# Patient Record
Sex: Female | Born: 1955
Health system: Southern US, Community
[De-identification: ages and names within clinical notes are randomized; demographics above are authoritative.]

## PROBLEM LIST (undated history)

## (undated) DIAGNOSIS — J45909 Unspecified asthma, uncomplicated: Secondary | ICD-10-CM

## (undated) DIAGNOSIS — I10 Essential (primary) hypertension: Secondary | ICD-10-CM

---

## 2015-02-04 ENCOUNTER — Institutional Professional Consult (permissible substitution) (HOSPITAL_COMMUNITY): Payer: Self-pay

## 2015-02-04 ENCOUNTER — Inpatient Hospital Stay
Admission: AD | Admit: 2015-02-04 | Discharge: 2015-03-19 | Disposition: A | Discharge status: Skilled Nursing Facility | Payer: Self-pay | Source: Ambulatory Visit | Attending: Internal Medicine | Admitting: Internal Medicine

## 2015-02-04 DIAGNOSIS — R509 Fever, unspecified: Secondary | ICD-10-CM

## 2015-02-04 DIAGNOSIS — J449 Chronic obstructive pulmonary disease, unspecified: Secondary | ICD-10-CM

## 2015-02-04 DIAGNOSIS — Z4659 Encounter for fitting and adjustment of other gastrointestinal appliance and device: Secondary | ICD-10-CM

## 2015-02-04 DIAGNOSIS — R1314 Dysphagia, pharyngoesophageal phase: Secondary | ICD-10-CM | POA: Insufficient documentation

## 2015-02-04 DIAGNOSIS — K7460 Unspecified cirrhosis of liver: Secondary | ICD-10-CM

## 2015-02-04 DIAGNOSIS — Z95828 Presence of other vascular implants and grafts: Secondary | ICD-10-CM

## 2015-02-04 DIAGNOSIS — J9690 Respiratory failure, unspecified, unspecified whether with hypoxia or hypercapnia: Secondary | ICD-10-CM

## 2015-02-04 DIAGNOSIS — J189 Pneumonia, unspecified organism: Secondary | ICD-10-CM

## 2015-02-04 HISTORY — DX: Essential (primary) hypertension: I10

## 2015-02-04 HISTORY — DX: Unspecified asthma, uncomplicated: J45.909

## 2015-02-05 ENCOUNTER — Other Ambulatory Visit (HOSPITAL_COMMUNITY): Payer: Self-pay

## 2015-02-05 LAB — URINALYSIS, ROUTINE W REFLEX MICROSCOPIC
Bilirubin Urine: NEGATIVE
Glucose, UA: NEGATIVE mg/dL
Ketones, ur: NEGATIVE mg/dL
Nitrite: POSITIVE — AB
Protein, ur: NEGATIVE mg/dL
Specific Gravity, Urine: 1.016 (ref 1.005–1.030)
Urobilinogen, UA: 0.2 mg/dL (ref 0.0–1.0)
pH: 8 (ref 5.0–8.0)

## 2015-02-05 LAB — CBC WITH DIFFERENTIAL/PLATELET
Basophils Absolute: 0 10*3/uL (ref 0.0–0.1)
Basophils Relative: 0 % (ref 0–1)
Eosinophils Absolute: 0.2 10*3/uL (ref 0.0–0.7)
Eosinophils Relative: 1 % (ref 0–5)
HCT: 30.5 % — ABNORMAL LOW (ref 36.0–46.0)
Hemoglobin: 9.9 g/dL — ABNORMAL LOW (ref 12.0–15.0)
Lymphocytes Relative: 8 % — ABNORMAL LOW (ref 12–46)
Lymphs Abs: 1.2 10*3/uL (ref 0.7–4.0)
MCH: 27.6 pg (ref 26.0–34.0)
MCHC: 32.5 g/dL (ref 30.0–36.0)
MCV: 85 fL (ref 78.0–100.0)
Monocytes Absolute: 1 10*3/uL (ref 0.1–1.0)
Monocytes Relative: 6 % (ref 3–12)
Neutro Abs: 13.4 10*3/uL — ABNORMAL HIGH (ref 1.7–7.7)
Neutrophils Relative %: 85 % — ABNORMAL HIGH (ref 43–77)
Platelets: 258 10*3/uL (ref 150–400)
RBC: 3.59 MIL/uL — ABNORMAL LOW (ref 3.87–5.11)
RDW: 17.9 % — ABNORMAL HIGH (ref 11.5–15.5)
WBC: 15.8 10*3/uL — ABNORMAL HIGH (ref 4.0–10.5)

## 2015-02-05 LAB — COMPREHENSIVE METABOLIC PANEL
ALBUMIN: 1.8 g/dL — AB (ref 3.5–5.0)
ALT: 25 U/L (ref 14–54)
AST: 35 U/L (ref 15–41)
Alkaline Phosphatase: 248 U/L — ABNORMAL HIGH (ref 38–126)
Anion gap: 11 (ref 5–15)
BUN: 14 mg/dL (ref 6–20)
CO2: 22 mmol/L (ref 22–32)
CREATININE: 0.51 mg/dL (ref 0.44–1.00)
Calcium: 8.5 mg/dL — ABNORMAL LOW (ref 8.9–10.3)
Chloride: 96 mmol/L — ABNORMAL LOW (ref 101–111)
Glucose, Bld: 162 mg/dL — ABNORMAL HIGH (ref 65–99)
Potassium: 4.6 mmol/L (ref 3.5–5.1)
Sodium: 129 mmol/L — ABNORMAL LOW (ref 135–145)
Total Bilirubin: 0.5 mg/dL (ref 0.3–1.2)
Total Protein: 5.9 g/dL — ABNORMAL LOW (ref 6.5–8.1)

## 2015-02-05 LAB — PHOSPHORUS: Phosphorus: 5.1 mg/dL — ABNORMAL HIGH (ref 2.5–4.6)

## 2015-02-05 LAB — URINE MICROSCOPIC-ADD ON

## 2015-02-05 LAB — TSH: TSH: 3.931 u[IU]/mL (ref 0.350–4.500)

## 2015-02-05 LAB — MAGNESIUM: Magnesium: 1.8 mg/dL (ref 1.7–2.4)

## 2015-02-06 LAB — BASIC METABOLIC PANEL
Anion gap: 8 (ref 5–15)
BUN: 16 mg/dL (ref 6–20)
CHLORIDE: 101 mmol/L (ref 101–111)
CO2: 22 mmol/L (ref 22–32)
CREATININE: 0.52 mg/dL (ref 0.44–1.00)
Calcium: 8.7 mg/dL — ABNORMAL LOW (ref 8.9–10.3)
GFR calc Af Amer: >60 mL/min (ref >60)
Glucose, Bld: 167 mg/dL — ABNORMAL HIGH (ref 65–99)
POTASSIUM: 3.8 mmol/L (ref 3.5–5.1)
Sodium: 131 mmol/L — ABNORMAL LOW (ref 135–145)

## 2015-02-06 LAB — CBC
HEMATOCRIT: 29.8 % — AB (ref 36.0–46.0)
Hemoglobin: 9.6 g/dL — ABNORMAL LOW (ref 12.0–15.0)
MCH: 27.7 pg (ref 26.0–34.0)
MCHC: 32.2 g/dL (ref 30.0–36.0)
MCV: 86.1 fL (ref 78.0–100.0)
Platelets: 266 10*3/uL (ref 150–400)
RBC: 3.46 MIL/uL — ABNORMAL LOW (ref 3.87–5.11)
RDW: 18.3 % — ABNORMAL HIGH (ref 11.5–15.5)
WBC: 19.5 10*3/uL — ABNORMAL HIGH (ref 4.0–10.5)

## 2015-02-07 LAB — CLOSTRIDIUM DIFFICILE BY PCR: CDIFFPCR: NEGATIVE

## 2015-02-08 ENCOUNTER — Other Ambulatory Visit (HOSPITAL_COMMUNITY): Payer: Self-pay

## 2015-02-08 ENCOUNTER — Institutional Professional Consult (permissible substitution) (HOSPITAL_COMMUNITY): Payer: Self-pay

## 2015-02-08 LAB — CBC WITH DIFFERENTIAL/PLATELET
BASOS PCT: 0 % (ref 0–1)
Basophils Absolute: 0 10*3/uL (ref 0.0–0.1)
EOS ABS: 0.4 10*3/uL (ref 0.0–0.7)
EOS PCT: 2 % (ref 0–5)
HEMATOCRIT: 28.1 % — AB (ref 36.0–46.0)
Hemoglobin: 9.1 g/dL — ABNORMAL LOW (ref 12.0–15.0)
LYMPHS ABS: 2.5 10*3/uL (ref 0.7–4.0)
Lymphocytes Relative: 12 % (ref 12–46)
MCH: 28.7 pg (ref 26.0–34.0)
MCHC: 32.4 g/dL (ref 30.0–36.0)
MCV: 88.6 fL (ref 78.0–100.0)
MONO ABS: 1 10*3/uL (ref 0.1–1.0)
Monocytes Relative: 5 % (ref 3–12)
Neutro Abs: 16.9 10*3/uL — ABNORMAL HIGH (ref 1.7–7.7)
Neutrophils Relative %: 81 % — ABNORMAL HIGH (ref 43–77)
Platelets: 266 10*3/uL (ref 150–400)
RBC: 3.17 MIL/uL — ABNORMAL LOW (ref 3.87–5.11)
RDW: 17.7 % — ABNORMAL HIGH (ref 11.5–15.5)
WBC: 20.8 10*3/uL — AB (ref 4.0–10.5)

## 2015-02-08 LAB — COMPREHENSIVE METABOLIC PANEL
ALK PHOS: 248 U/L — AB (ref 38–126)
ALT: 24 U/L (ref 14–54)
AST: 34 U/L (ref 15–41)
Albumin: 1.8 g/dL — ABNORMAL LOW (ref 3.5–5.0)
Anion gap: 8 (ref 5–15)
BUN: 16 mg/dL (ref 6–20)
CO2: 24 mmol/L (ref 22–32)
Calcium: 8.7 mg/dL — ABNORMAL LOW (ref 8.9–10.3)
Chloride: 101 mmol/L (ref 101–111)
Creatinine, Ser: 0.42 mg/dL — ABNORMAL LOW (ref 0.44–1.00)
GFR calc Af Amer: >60 mL/min (ref >60)
GLUCOSE: 135 mg/dL — AB (ref 65–99)
Potassium: 4.3 mmol/L (ref 3.5–5.1)
Sodium: 133 mmol/L — ABNORMAL LOW (ref 135–145)
Total Bilirubin: 0.6 mg/dL (ref 0.3–1.2)
Total Protein: 6 g/dL — ABNORMAL LOW (ref 6.5–8.1)

## 2015-02-08 LAB — LACTIC ACID, PLASMA
LACTIC ACID, VENOUS: 1.1 mmol/L (ref 0.5–2.0)
Lactic Acid, Venous: 1.1 mmol/L (ref 0.5–2.0)

## 2015-02-08 LAB — URINE CULTURE: Culture: >=100000

## 2015-02-09 LAB — URINALYSIS, ROUTINE W REFLEX MICROSCOPIC
BILIRUBIN URINE: NEGATIVE
Glucose, UA: NEGATIVE mg/dL
KETONES UR: NEGATIVE mg/dL
NITRITE: NEGATIVE
PH: 7.5 (ref 5.0–8.0)
PROTEIN: 30 mg/dL — AB
SPECIFIC GRAVITY, URINE: 1.016 (ref 1.005–1.030)
Urobilinogen, UA: 0.2 mg/dL (ref 0.0–1.0)

## 2015-02-09 LAB — CBC WITH DIFFERENTIAL/PLATELET
BASOS ABS: 0 10*3/uL (ref 0.0–0.1)
Basophils Relative: 0 % (ref 0–1)
EOS PCT: 2 % (ref 0–5)
Eosinophils Absolute: 0.4 10*3/uL (ref 0.0–0.7)
HCT: 30.3 % — ABNORMAL LOW (ref 36.0–46.0)
Hemoglobin: 9.7 g/dL — ABNORMAL LOW (ref 12.0–15.0)
Lymphocytes Relative: 10 % — ABNORMAL LOW (ref 12–46)
Lymphs Abs: 1.9 10*3/uL (ref 0.7–4.0)
MCH: 28.3 pg (ref 26.0–34.0)
MCHC: 32 g/dL (ref 30.0–36.0)
MCV: 88.3 fL (ref 78.0–100.0)
MONOS PCT: 5 % (ref 3–12)
Monocytes Absolute: 1 10*3/uL (ref 0.1–1.0)
NEUTROS ABS: 15.8 10*3/uL — AB (ref 1.7–7.7)
NEUTROS PCT: 83 % — AB (ref 43–77)
PLATELETS: 271 10*3/uL (ref 150–400)
RBC: 3.43 MIL/uL — AB (ref 3.87–5.11)
RDW: 17.7 % — AB (ref 11.5–15.5)
WBC: 19.1 10*3/uL — ABNORMAL HIGH (ref 4.0–10.5)

## 2015-02-09 LAB — BASIC METABOLIC PANEL
Anion gap: 6 (ref 5–15)
BUN: 14 mg/dL (ref 6–20)
CO2: 23 mmol/L (ref 22–32)
Calcium: 8.4 mg/dL — ABNORMAL LOW (ref 8.9–10.3)
Chloride: 102 mmol/L (ref 101–111)
Creatinine, Ser: 0.38 mg/dL — ABNORMAL LOW (ref 0.44–1.00)
GFR calc Af Amer: >60 mL/min (ref >60)
GFR calc non Af Amer: >60 mL/min (ref >60)
GLUCOSE: 159 mg/dL — AB (ref 65–99)
Potassium: 4.3 mmol/L (ref 3.5–5.1)
SODIUM: 131 mmol/L — AB (ref 135–145)

## 2015-02-09 LAB — URINE MICROSCOPIC-ADD ON

## 2015-02-11 LAB — CBC WITH DIFFERENTIAL/PLATELET
Basophils Absolute: 0 10*3/uL (ref 0.0–0.1)
Basophils Relative: 0 % (ref 0–1)
Eosinophils Absolute: 0.3 10*3/uL (ref 0.0–0.7)
Eosinophils Relative: 2 % (ref 0–5)
HCT: 27.7 % — ABNORMAL LOW (ref 36.0–46.0)
HEMOGLOBIN: 9 g/dL — AB (ref 12.0–15.0)
Lymphocytes Relative: 12 % (ref 12–46)
Lymphs Abs: 2.1 10*3/uL (ref 0.7–4.0)
MCH: 28.7 pg (ref 26.0–34.0)
MCHC: 32.5 g/dL (ref 30.0–36.0)
MCV: 88.2 fL (ref 78.0–100.0)
MONO ABS: 1 10*3/uL (ref 0.1–1.0)
MONOS PCT: 6 % (ref 3–12)
Neutro Abs: 13.9 10*3/uL — ABNORMAL HIGH (ref 1.7–7.7)
Neutrophils Relative %: 80 % — ABNORMAL HIGH (ref 43–77)
PLATELETS: 282 10*3/uL (ref 150–400)
RBC: 3.14 MIL/uL — ABNORMAL LOW (ref 3.87–5.11)
RDW: 17.5 % — AB (ref 11.5–15.5)
WBC: 17.3 10*3/uL — ABNORMAL HIGH (ref 4.0–10.5)

## 2015-02-11 LAB — BASIC METABOLIC PANEL
Anion gap: 7 (ref 5–15)
BUN: 17 mg/dL (ref 6–20)
CHLORIDE: 103 mmol/L (ref 101–111)
CO2: 23 mmol/L (ref 22–32)
Calcium: 8.7 mg/dL — ABNORMAL LOW (ref 8.9–10.3)
Creatinine, Ser: 0.36 mg/dL — ABNORMAL LOW (ref 0.44–1.00)
GFR calc non Af Amer: >60 mL/min (ref >60)
Glucose, Bld: 123 mg/dL — ABNORMAL HIGH (ref 65–99)
Potassium: 4.9 mmol/L (ref 3.5–5.1)
SODIUM: 133 mmol/L — AB (ref 135–145)

## 2015-02-11 LAB — URINE CULTURE: CULTURE: NO GROWTH

## 2015-02-11 LAB — CLOSTRIDIUM DIFFICILE BY PCR: Toxigenic C. Difficile by PCR: NEGATIVE

## 2015-02-11 LAB — PROCALCITONIN: Procalcitonin: 0.4 ng/mL

## 2015-02-13 LAB — CULTURE, BLOOD (ROUTINE X 2)
CULTURE: NO GROWTH
Culture: NO GROWTH

## 2015-02-15 ENCOUNTER — Other Ambulatory Visit (HOSPITAL_COMMUNITY): Payer: Self-pay

## 2015-02-15 LAB — COMPREHENSIVE METABOLIC PANEL
ALBUMIN: 1.8 g/dL — AB (ref 3.5–5.0)
ALK PHOS: 205 U/L — AB (ref 38–126)
ALT: 17 U/L (ref 14–54)
ANION GAP: 7 (ref 5–15)
AST: 31 U/L (ref 15–41)
BILIRUBIN TOTAL: 0.4 mg/dL (ref 0.3–1.2)
BUN: 16 mg/dL (ref 6–20)
CHLORIDE: 102 mmol/L (ref 101–111)
CO2: 27 mmol/L (ref 22–32)
Calcium: 8.9 mg/dL (ref 8.9–10.3)
Creatinine, Ser: 0.51 mg/dL (ref 0.44–1.00)
GFR calc non Af Amer: >60 mL/min (ref >60)
GLUCOSE: 120 mg/dL — AB (ref 65–99)
Potassium: 4.7 mmol/L (ref 3.5–5.1)
SODIUM: 136 mmol/L (ref 135–145)
Total Protein: 6.6 g/dL (ref 6.5–8.1)

## 2015-02-15 LAB — CBC WITH DIFFERENTIAL/PLATELET
Basophils Absolute: 0.1 10*3/uL (ref 0.0–0.1)
Basophils Relative: 1 % (ref 0–1)
Eosinophils Absolute: 0.6 10*3/uL (ref 0.0–0.7)
Eosinophils Relative: 4 % (ref 0–5)
HCT: 28.1 % — ABNORMAL LOW (ref 36.0–46.0)
Hemoglobin: 8.8 g/dL — ABNORMAL LOW (ref 12.0–15.0)
LYMPHS PCT: 22 % (ref 12–46)
Lymphs Abs: 3.1 10*3/uL (ref 0.7–4.0)
MCH: 27.8 pg (ref 26.0–34.0)
MCHC: 31.3 g/dL (ref 30.0–36.0)
MCV: 88.9 fL (ref 78.0–100.0)
MONO ABS: 0.8 10*3/uL (ref 0.1–1.0)
Monocytes Relative: 6 % (ref 3–12)
Neutro Abs: 9.4 10*3/uL — ABNORMAL HIGH (ref 1.7–7.7)
Neutrophils Relative %: 67 % (ref 43–77)
PLATELETS: 351 10*3/uL (ref 150–400)
RBC: 3.16 MIL/uL — ABNORMAL LOW (ref 3.87–5.11)
RDW: 16.7 % — ABNORMAL HIGH (ref 11.5–15.5)
WBC: 14 10*3/uL — ABNORMAL HIGH (ref 4.0–10.5)

## 2015-02-15 LAB — URINALYSIS, ROUTINE W REFLEX MICROSCOPIC
Bilirubin Urine: NEGATIVE
Glucose, UA: NEGATIVE mg/dL
HGB URINE DIPSTICK: NEGATIVE
Ketones, ur: NEGATIVE mg/dL
NITRITE: NEGATIVE
PROTEIN: NEGATIVE mg/dL
Specific Gravity, Urine: 1.021 (ref 1.005–1.030)
UROBILINOGEN UA: 1 mg/dL (ref 0.0–1.0)
pH: 7.5 (ref 5.0–8.0)

## 2015-02-15 LAB — URINE MICROSCOPIC-ADD ON

## 2015-02-15 LAB — MAGNESIUM: Magnesium: 1.8 mg/dL (ref 1.7–2.4)

## 2015-02-15 LAB — PHOSPHORUS: Phosphorus: 4.2 mg/dL (ref 2.5–4.6)

## 2015-02-16 LAB — BASIC METABOLIC PANEL
Anion gap: 9 (ref 5–15)
BUN: 12 mg/dL (ref 6–20)
CALCIUM: 9.2 mg/dL (ref 8.9–10.3)
CHLORIDE: 98 mmol/L — AB (ref 101–111)
CO2: 26 mmol/L (ref 22–32)
CREATININE: 0.31 mg/dL — AB (ref 0.44–1.00)
GFR calc non Af Amer: >60 mL/min (ref >60)
Glucose, Bld: 126 mg/dL — ABNORMAL HIGH (ref 65–99)
Potassium: 4.4 mmol/L (ref 3.5–5.1)
Sodium: 133 mmol/L — ABNORMAL LOW (ref 135–145)

## 2015-02-16 LAB — CBC WITH DIFFERENTIAL/PLATELET
Basophils Absolute: 0.1 10*3/uL (ref 0.0–0.1)
Basophils Relative: 1 % (ref 0–1)
EOS PCT: 4 % (ref 0–5)
Eosinophils Absolute: 0.6 10*3/uL (ref 0.0–0.7)
HCT: 29 % — ABNORMAL LOW (ref 36.0–46.0)
HEMOGLOBIN: 9.1 g/dL — AB (ref 12.0–15.0)
LYMPHS PCT: 22 % (ref 12–46)
Lymphs Abs: 2.7 10*3/uL (ref 0.7–4.0)
MCH: 28 pg (ref 26.0–34.0)
MCHC: 31.4 g/dL (ref 30.0–36.0)
MCV: 89.2 fL (ref 78.0–100.0)
Monocytes Absolute: 0.7 10*3/uL (ref 0.1–1.0)
Monocytes Relative: 5 % (ref 3–12)
NEUTROS ABS: 8.5 10*3/uL — AB (ref 1.7–7.7)
NEUTROS PCT: 68 % (ref 43–77)
Platelets: 356 10*3/uL (ref 150–400)
RBC: 3.25 MIL/uL — AB (ref 3.87–5.11)
RDW: 16.4 % — ABNORMAL HIGH (ref 11.5–15.5)
WBC: 12.4 10*3/uL — ABNORMAL HIGH (ref 4.0–10.5)

## 2015-02-16 LAB — C DIFFICILE QUICK SCREEN W PCR REFLEX
C DIFFICLE (CDIFF) ANTIGEN: POSITIVE — AB
C Diff toxin: NEGATIVE

## 2015-02-16 LAB — CLOSTRIDIUM DIFFICILE BY PCR: Toxigenic C. Difficile by PCR: POSITIVE — AB

## 2015-02-16 LAB — PHOSPHORUS: Phosphorus: 4.6 mg/dL (ref 2.5–4.6)

## 2015-02-16 LAB — MAGNESIUM: Magnesium: 1.8 mg/dL (ref 1.7–2.4)

## 2015-02-17 LAB — URINE CULTURE: CULTURE: NO GROWTH

## 2015-02-19 ENCOUNTER — Other Ambulatory Visit (HOSPITAL_COMMUNITY): Payer: Self-pay

## 2015-02-20 LAB — CBC WITH DIFFERENTIAL/PLATELET
BASOS PCT: 1 % (ref 0–1)
Basophils Absolute: 0.1 10*3/uL (ref 0.0–0.1)
EOS ABS: 0.4 10*3/uL (ref 0.0–0.7)
Eosinophils Relative: 3 % (ref 0–5)
HCT: 29.7 % — ABNORMAL LOW (ref 36.0–46.0)
Hemoglobin: 9.5 g/dL — ABNORMAL LOW (ref 12.0–15.0)
LYMPHS ABS: 2.9 10*3/uL (ref 0.7–4.0)
Lymphocytes Relative: 26 % (ref 12–46)
MCH: 28.2 pg (ref 26.0–34.0)
MCHC: 32 g/dL (ref 30.0–36.0)
MCV: 88.1 fL (ref 78.0–100.0)
Monocytes Absolute: 0.8 10*3/uL (ref 0.1–1.0)
Monocytes Relative: 7 % (ref 3–12)
Neutro Abs: 6.9 10*3/uL (ref 1.7–7.7)
Neutrophils Relative %: 63 % (ref 43–77)
Platelets: 395 10*3/uL (ref 150–400)
RBC: 3.37 MIL/uL — ABNORMAL LOW (ref 3.87–5.11)
RDW: 15.5 % (ref 11.5–15.5)
WBC: 10.9 10*3/uL — ABNORMAL HIGH (ref 4.0–10.5)

## 2015-02-20 LAB — BASIC METABOLIC PANEL
Anion gap: 10 (ref 5–15)
BUN: 9 mg/dL (ref 6–20)
CALCIUM: 8.9 mg/dL (ref 8.9–10.3)
CO2: 25 mmol/L (ref 22–32)
Chloride: 98 mmol/L — ABNORMAL LOW (ref 101–111)
Creatinine, Ser: 0.37 mg/dL — ABNORMAL LOW (ref 0.44–1.00)
Glucose, Bld: 164 mg/dL — ABNORMAL HIGH (ref 65–99)
Potassium: 4.8 mmol/L (ref 3.5–5.1)
SODIUM: 133 mmol/L — AB (ref 135–145)

## 2015-02-20 LAB — PROCALCITONIN: Procalcitonin: 0.16 ng/mL

## 2015-02-22 ENCOUNTER — Other Ambulatory Visit (HOSPITAL_COMMUNITY): Payer: Self-pay

## 2015-02-22 LAB — URINALYSIS, ROUTINE W REFLEX MICROSCOPIC
Bilirubin Urine: NEGATIVE
GLUCOSE, UA: NEGATIVE mg/dL
KETONES UR: NEGATIVE mg/dL
Nitrite: NEGATIVE
PROTEIN: NEGATIVE mg/dL
Specific Gravity, Urine: 1.024 (ref 1.005–1.030)
Urobilinogen, UA: 0.2 mg/dL (ref 0.0–1.0)
pH: 7 (ref 5.0–8.0)

## 2015-02-22 LAB — URINE MICROSCOPIC-ADD ON

## 2015-02-23 ENCOUNTER — Other Ambulatory Visit (HOSPITAL_COMMUNITY): Payer: Self-pay

## 2015-02-23 LAB — COMPREHENSIVE METABOLIC PANEL
ALK PHOS: 153 U/L — AB (ref 38–126)
ALT: 15 U/L (ref 14–54)
ANION GAP: 10 (ref 5–15)
AST: 26 U/L (ref 15–41)
Albumin: 2.3 g/dL — ABNORMAL LOW (ref 3.5–5.0)
BILIRUBIN TOTAL: 0.3 mg/dL (ref 0.3–1.2)
BUN: 11 mg/dL (ref 6–20)
CHLORIDE: 96 mmol/L — AB (ref 101–111)
CO2: 27 mmol/L (ref 22–32)
CREATININE: 0.34 mg/dL — AB (ref 0.44–1.00)
Calcium: 9.3 mg/dL (ref 8.9–10.3)
GFR calc non Af Amer: >60 mL/min (ref >60)
Glucose, Bld: 120 mg/dL — ABNORMAL HIGH (ref 65–99)
Potassium: 4.2 mmol/L (ref 3.5–5.1)
Sodium: 133 mmol/L — ABNORMAL LOW (ref 135–145)
TOTAL PROTEIN: 7.4 g/dL (ref 6.5–8.1)

## 2015-02-23 LAB — CBC WITH DIFFERENTIAL/PLATELET
Basophils Absolute: 0 10*3/uL (ref 0.0–0.1)
Basophils Relative: 0 % (ref 0–1)
EOS PCT: 3 % (ref 0–5)
Eosinophils Absolute: 0.4 10*3/uL (ref 0.0–0.7)
HCT: 28.1 % — ABNORMAL LOW (ref 36.0–46.0)
HEMOGLOBIN: 9 g/dL — AB (ref 12.0–15.0)
Lymphocytes Relative: 25 % (ref 12–46)
Lymphs Abs: 3.1 10*3/uL (ref 0.7–4.0)
MCH: 28 pg (ref 26.0–34.0)
MCHC: 32 g/dL (ref 30.0–36.0)
MCV: 87.5 fL (ref 78.0–100.0)
MONOS PCT: 6 % (ref 3–12)
Monocytes Absolute: 0.8 10*3/uL (ref 0.1–1.0)
Neutro Abs: 8 10*3/uL — ABNORMAL HIGH (ref 1.7–7.7)
Neutrophils Relative %: 66 % (ref 43–77)
PLATELETS: 432 10*3/uL — AB (ref 150–400)
RBC: 3.21 MIL/uL — AB (ref 3.87–5.11)
RDW: 14.9 % (ref 11.5–15.5)
WBC: 12.2 10*3/uL — ABNORMAL HIGH (ref 4.0–10.5)

## 2015-02-23 LAB — PROCALCITONIN: PROCALCITONIN: 0.22 ng/mL

## 2015-02-24 ENCOUNTER — Other Ambulatory Visit (HOSPITAL_COMMUNITY): Payer: Self-pay

## 2015-02-24 LAB — URINE CULTURE: Culture: 50000

## 2015-02-25 LAB — BASIC METABOLIC PANEL
Anion gap: 11 (ref 5–15)
BUN: 26 mg/dL — AB (ref 6–20)
CHLORIDE: 99 mmol/L — AB (ref 101–111)
CO2: 23 mmol/L (ref 22–32)
Calcium: 9.1 mg/dL (ref 8.9–10.3)
Creatinine, Ser: 0.46 mg/dL (ref 0.44–1.00)
GFR calc non Af Amer: >60 mL/min (ref >60)
Glucose, Bld: 201 mg/dL — ABNORMAL HIGH (ref 65–99)
POTASSIUM: 4.4 mmol/L (ref 3.5–5.1)
Sodium: 133 mmol/L — ABNORMAL LOW (ref 135–145)

## 2015-02-25 LAB — CBC WITH DIFFERENTIAL/PLATELET
Basophils Absolute: 0 10*3/uL (ref 0.0–0.1)
Basophils Relative: 0 % (ref 0–1)
EOS ABS: 0.5 10*3/uL (ref 0.0–0.7)
EOS PCT: 2 % (ref 0–5)
HEMATOCRIT: 27 % — AB (ref 36.0–46.0)
HEMOGLOBIN: 8.8 g/dL — AB (ref 12.0–15.0)
LYMPHS PCT: 14 % (ref 12–46)
Lymphs Abs: 3.8 10*3/uL (ref 0.7–4.0)
MCH: 28.6 pg (ref 26.0–34.0)
MCHC: 32.6 g/dL (ref 30.0–36.0)
MCV: 87.7 fL (ref 78.0–100.0)
Monocytes Absolute: 1.6 10*3/uL — ABNORMAL HIGH (ref 0.1–1.0)
Monocytes Relative: 6 % (ref 3–12)
NEUTROS PCT: 78 % — AB (ref 43–77)
Neutro Abs: 21.2 10*3/uL — ABNORMAL HIGH (ref 1.7–7.7)
PLATELETS: 480 10*3/uL — AB (ref 150–400)
RBC: 3.08 MIL/uL — AB (ref 3.87–5.11)
RDW: 14.9 % (ref 11.5–15.5)
WBC MORPHOLOGY: INCREASED
WBC: 27.1 10*3/uL — AB (ref 4.0–10.5)

## 2015-02-25 LAB — CULTURE, BLOOD (ROUTINE X 2)

## 2015-02-25 LAB — PROCALCITONIN: Procalcitonin: 2.37 ng/mL

## 2015-02-26 ENCOUNTER — Other Ambulatory Visit (HOSPITAL_COMMUNITY): Payer: Self-pay

## 2015-02-26 LAB — CBC WITH DIFFERENTIAL/PLATELET
BASOS PCT: 0 % (ref 0–1)
Basophils Absolute: 0 10*3/uL (ref 0.0–0.1)
EOS PCT: 2 % (ref 0–5)
Eosinophils Absolute: 0.5 10*3/uL (ref 0.0–0.7)
HEMATOCRIT: 24.1 % — AB (ref 36.0–46.0)
HEMOGLOBIN: 7.7 g/dL — AB (ref 12.0–15.0)
LYMPHS PCT: 13 % (ref 12–46)
Lymphs Abs: 2.9 10*3/uL (ref 0.7–4.0)
MCH: 27.5 pg (ref 26.0–34.0)
MCHC: 32 g/dL (ref 30.0–36.0)
MCV: 86.1 fL (ref 78.0–100.0)
MONOS PCT: 6 % (ref 3–12)
Monocytes Absolute: 1.4 10*3/uL — ABNORMAL HIGH (ref 0.1–1.0)
NEUTROS ABS: 17.8 10*3/uL — AB (ref 1.7–7.7)
NEUTROS PCT: 79 % — AB (ref 43–77)
Platelets: 402 10*3/uL — ABNORMAL HIGH (ref 150–400)
RBC: 2.8 MIL/uL — AB (ref 3.87–5.11)
RDW: 14.7 % (ref 11.5–15.5)
WBC MORPHOLOGY: INCREASED
WBC: 22.6 10*3/uL — ABNORMAL HIGH (ref 4.0–10.5)

## 2015-02-26 LAB — MAGNESIUM: MAGNESIUM: 1.8 mg/dL (ref 1.7–2.4)

## 2015-02-26 LAB — BASIC METABOLIC PANEL
Anion gap: 11 (ref 5–15)
BUN: 28 mg/dL — ABNORMAL HIGH (ref 6–20)
CALCIUM: 8.8 mg/dL — AB (ref 8.9–10.3)
CO2: 22 mmol/L (ref 22–32)
Chloride: 98 mmol/L — ABNORMAL LOW (ref 101–111)
Creatinine, Ser: 0.55 mg/dL (ref 0.44–1.00)
GFR calc Af Amer: >60 mL/min (ref >60)
GFR calc non Af Amer: >60 mL/min (ref >60)
GLUCOSE: 170 mg/dL — AB (ref 65–99)
Potassium: 4.4 mmol/L (ref 3.5–5.1)
Sodium: 131 mmol/L — ABNORMAL LOW (ref 135–145)

## 2015-02-26 LAB — OCCULT BLOOD X 1 CARD TO LAB, STOOL: Fecal Occult Bld: NEGATIVE

## 2015-02-26 LAB — PHOSPHORUS: PHOSPHORUS: 4.5 mg/dL (ref 2.5–4.6)

## 2015-02-27 LAB — COMPREHENSIVE METABOLIC PANEL
ALT: 16 U/L (ref 14–54)
AST: 28 U/L (ref 15–41)
Albumin: 1.9 g/dL — ABNORMAL LOW (ref 3.5–5.0)
Alkaline Phosphatase: 151 U/L — ABNORMAL HIGH (ref 38–126)
Anion gap: 9 (ref 5–15)
BUN: 16 mg/dL (ref 6–20)
CALCIUM: 8.8 mg/dL — AB (ref 8.9–10.3)
CHLORIDE: 99 mmol/L — AB (ref 101–111)
CO2: 23 mmol/L (ref 22–32)
Creatinine, Ser: 0.4 mg/dL — ABNORMAL LOW (ref 0.44–1.00)
GFR calc non Af Amer: >60 mL/min (ref >60)
Glucose, Bld: 151 mg/dL — ABNORMAL HIGH (ref 65–99)
POTASSIUM: 3.8 mmol/L (ref 3.5–5.1)
SODIUM: 131 mmol/L — AB (ref 135–145)
TOTAL PROTEIN: 5.8 g/dL — AB (ref 6.5–8.1)
Total Bilirubin: 0.4 mg/dL (ref 0.3–1.2)

## 2015-02-27 LAB — CBC WITH DIFFERENTIAL/PLATELET
BASOS PCT: 0 % (ref 0–1)
Basophils Absolute: 0 10*3/uL (ref 0.0–0.1)
EOS ABS: 0.4 10*3/uL (ref 0.0–0.7)
Eosinophils Relative: 2 % (ref 0–5)
HCT: 25.6 % — ABNORMAL LOW (ref 36.0–46.0)
HEMOGLOBIN: 8 g/dL — AB (ref 12.0–15.0)
LYMPHS ABS: 2.3 10*3/uL (ref 0.7–4.0)
Lymphocytes Relative: 13 % (ref 12–46)
MCH: 27.2 pg (ref 26.0–34.0)
MCHC: 31.3 g/dL (ref 30.0–36.0)
MCV: 87.1 fL (ref 78.0–100.0)
MONOS PCT: 5 % (ref 3–12)
Monocytes Absolute: 0.9 10*3/uL (ref 0.1–1.0)
Neutro Abs: 14.4 10*3/uL — ABNORMAL HIGH (ref 1.7–7.7)
Neutrophils Relative %: 80 % — ABNORMAL HIGH (ref 43–77)
PLATELETS: 366 10*3/uL (ref 150–400)
RBC: 2.94 MIL/uL — ABNORMAL LOW (ref 3.87–5.11)
RDW: 14.8 % (ref 11.5–15.5)
WBC: 18 10*3/uL — ABNORMAL HIGH (ref 4.0–10.5)

## 2015-02-27 LAB — CULTURE, BLOOD (ROUTINE X 2): Culture: NO GROWTH

## 2015-03-01 ENCOUNTER — Other Ambulatory Visit (HOSPITAL_COMMUNITY): Payer: Self-pay

## 2015-03-01 LAB — BASIC METABOLIC PANEL
ANION GAP: 11 (ref 5–15)
BUN: 9 mg/dL (ref 6–20)
CALCIUM: 8.1 mg/dL — AB (ref 8.9–10.3)
CO2: 24 mmol/L (ref 22–32)
Chloride: 92 mmol/L — ABNORMAL LOW (ref 101–111)
Creatinine, Ser: 0.41 mg/dL — ABNORMAL LOW (ref 0.44–1.00)
GFR calc non Af Amer: >60 mL/min (ref >60)
Glucose, Bld: 477 mg/dL — ABNORMAL HIGH (ref 65–99)
Potassium: 4.2 mmol/L (ref 3.5–5.1)
Sodium: 127 mmol/L — ABNORMAL LOW (ref 135–145)

## 2015-03-01 LAB — CULTURE, BLOOD (ROUTINE X 2)
Culture: NO GROWTH
Culture: NO GROWTH

## 2015-03-01 LAB — CBC WITH DIFFERENTIAL/PLATELET
BASOS ABS: 0.1 10*3/uL (ref 0.0–0.1)
BASOS PCT: 0 % (ref 0–1)
Eosinophils Absolute: 0.3 10*3/uL (ref 0.0–0.7)
Eosinophils Relative: 2 % (ref 0–5)
HEMATOCRIT: 23.4 % — AB (ref 36.0–46.0)
HEMOGLOBIN: 7.4 g/dL — AB (ref 12.0–15.0)
Lymphocytes Relative: 22 % (ref 12–46)
Lymphs Abs: 3.6 10*3/uL (ref 0.7–4.0)
MCH: 28 pg (ref 26.0–34.0)
MCHC: 31.6 g/dL (ref 30.0–36.0)
MCV: 88.6 fL (ref 78.0–100.0)
Monocytes Absolute: 1 10*3/uL (ref 0.1–1.0)
Monocytes Relative: 6 % (ref 3–12)
NEUTROS ABS: 11.6 10*3/uL — AB (ref 1.7–7.7)
NEUTROS PCT: 70 % (ref 43–77)
Platelets: 350 10*3/uL (ref 150–400)
RBC: 2.64 MIL/uL — AB (ref 3.87–5.11)
RDW: 15.1 % (ref 11.5–15.5)
WBC: 16.5 10*3/uL — ABNORMAL HIGH (ref 4.0–10.5)

## 2015-03-01 LAB — MAGNESIUM: Magnesium: 1.5 mg/dL — ABNORMAL LOW (ref 1.7–2.4)

## 2015-03-01 LAB — PHOSPHORUS: PHOSPHORUS: 4.5 mg/dL (ref 2.5–4.6)

## 2015-03-01 NOTE — Progress Notes (Signed)
IR aware of request for G-tube placement. Review of chart finds no cross-sectional imaging of the abdomen. Pt has NG tube intact by plain X-ray. Will need non-contrasted CT of abdomen to eval for candidacy of percutaneous approach G-tube placement. IR will follow up after CT.  Brayton El PA-C Interventional Radiology 03/01/2015 3:04 PM

## 2015-03-02 DIAGNOSIS — R1314 Dysphagia, pharyngoesophageal phase: Secondary | ICD-10-CM | POA: Insufficient documentation

## 2015-03-02 LAB — URINE MICROSCOPIC-ADD ON

## 2015-03-02 LAB — URINALYSIS, ROUTINE W REFLEX MICROSCOPIC
Bilirubin Urine: NEGATIVE
Glucose, UA: NEGATIVE mg/dL
Ketones, ur: NEGATIVE mg/dL
NITRITE: NEGATIVE
PH: 6 (ref 5.0–8.0)
Protein, ur: NEGATIVE mg/dL
SPECIFIC GRAVITY, URINE: 1.024 (ref 1.005–1.030)
Urobilinogen, UA: 0.2 mg/dL (ref 0.0–1.0)

## 2015-03-02 NOTE — H&P (Signed)
Chief Complaint: Patient was seen in consultation today to plan placement of percutaneous gastrostomy tube at the request of Marie Ward  Referring Physician(s): Marie Ward  History of Present Illness: Marie Ward is a 59 y.o. 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  She also has dysphagia and is currently receiving tube feeds via [place].  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  No past medical history on file.  No past surgical history on file.  Allergies: Review of patient's allergies indicates not on file.  Medications: Prior to Admission medications   Not on File     No family history on file.  Social History   Social History  . Marital Status: Widowed    Spouse Name: N/A  . Number of Children: N/A  . Years of Education: N/A   Social History Main Topics  . Smoking status: Not on file  . Smokeless tobacco: Not on file  . Alcohol Use: Not on file  . Drug Use: Not on file  . Sexual Activity: Not on file   Other Topics Concern  . Not on file   Social History Narrative  . No narrative on file     Review of Systems  Unable to perform ROS: Other  Aphasia/dysarthria  Vital Signs:  Physical Exam  Constitutional: She appears well-developed and well-nourished.  HENT:  Head: Normocephalic and atraumatic.  Neck: Neck supple.  Cardiovascular: Normal rate, regular rhythm and normal heart sounds.   Pulmonary/Chest: Effort normal and breath sounds normal.  Abdominal: Soft. Bowel sounds are normal.  No scars seen  Musculoskeletal:  Weakness of both upper extremities  Neurological: She exhibits abnormal muscle tone.  Awake but does not communicate well  Skin: Skin is warm.  Very dry and flakey        Mallampati Score:  MD Evaluation Airway: WNL Heart: WNL Abdomen: WNL Chest/ Lungs: WNL ASA  Classification: 3 Mallampati/Airway Score: Two  Imaging: Ct Abdomen Pelvis Wo Contrast  03/01/2015   CLINICAL DATA:  Preprocedure evaluation for possible G-tube placement, cirrhosis  EXAM: CT ABDOMEN AND PELVIS WITHOUT CONTRAST  TECHNIQUE: Multidetector CT imaging of the abdomen and pelvis was performed following the standard protocol without IV contrast.  COMPARISON:  Abdominal ultrasound dated 02/08/2015  FINDINGS: Motion degraded images.  Lower chest:  Dependent atelectasis in the right lower lobe.  Hepatobiliary: Nodular hepatic contour, suggesting cirrhosis.  Layering gallstones (series 2/ image 36), without associated inflammatory changes.  Pancreas: Within normal limits.  Spleen: Within normal limits.  Adrenals/Urinary Tract: Adrenal glands within normal limits.  Kidneys are grossly unremarkable. No renal calculi or hydronephrosis.  Bladder is decompressed by indwelling Foley catheter.  Stomach/Bowel: Stomach is notable for an indwelling weighted feeding tube.  Satisfactory position of the distal gastric body beneath the anterior abdominal wall (series 2/ image 41), just inferior to the left hepatic margin, without interposition of colon.  No evidence of bowel obstruction.  Normal appendix.  Colonic diverticulosis, without evidence of diverticulitis.  Vascular/Lymphatic: Atherosclerotic calcifications of the abdominal aorta and branch vessels.  No suspicious abdominopelvic lymphadenopathy.  Reproductive: Uterus is unremarkable.  Left ovary is within normal limits.  No right adnexal mass.  Other: No abdominopelvic ascites.  Musculoskeletal: Degenerative changes of the visualized thoracolumbar  spine.  Mild superior endplate compression fracture deformity at L2.  IMPRESSION: Motion degraded images.  Satisfactory position of the distal gastric body beneath the anterior abdominal wall, just inferior to the  left hepatic margin, without interposition of colon.  Nodular hepatic contour, suggesting cirrhosis.  Cholelithiasis, without associated inflammatory changes.   Electronically Signed   By: Charline Bills M.D.   On: 03/01/2015 21:34   Dg Chest 1 View  02/08/2015   CLINICAL DATA:  Pneumonia  EXAM: CHEST  1 VIEW  COMPARISON:  02/05/2015  FINDINGS: Cardiac shadow is within normal limits. Feeding catheter is noted within the stomach. The lungs are well aerated without focal confluent infiltrate. Minimal right perihilar atelectasis is noted. No acute bony abnormality is seen. Chronic changes about the right shoulder joint are noted.  IMPRESSION: Minimal right perihilar atelectasis.   Electronically Signed   By: Alcide Clever M.D.   On: 02/08/2015 11:31   US Abdomen Complete  02/08/2015   CLINICAL DATA:  Hepatic cirrhosis.  EXAM: ULTRASOUND ABDOMEN COMPLETE  COMPARISON:  None.  FINDINGS: Gallbladder: A small amount of layering sludge is noted within the gallbladder. No discrete gallstones are seen. A 5 mm nonmobile nodular density is seen along the dependent gallbladder wall which shows no evidence of acoustic shadowing. This could represent a small polyp or tumefactive sludge. No evidence of gallbladder wall thickening or pericholecystic fluid. No sonographic Murphy sign noted by sonographer.  Common bile duct: Diameter: 3 mm, within normal limits.  Liver: No focal lesion identified. Coarsening of hepatic echotexture and mild nodularity of capsular contour seen, consistent with history of hepatic cirrhosis. No evidence of ascites.  IVC: No abnormality visualized.  Pancreas: Visualized portion unremarkable.  Spleen: Size and appearance within normal limits.  Right Kidney: Length: 11.3 cm. Echogenicity within normal limits. Tiny sub-cm cyst noted in upper pole. No mass or hydronephrosis visualized.  Left Kidney: Length: 11.6 cm. Echogenicity within normal limits. No mass or hydronephrosis visualized.  Abdominal  aorta: No aneurysm visualized.  Other findings: None.  IMPRESSION: Gallbladder sludge, with possible 5 mm gallbladder polyp versus tumefactive sludge. No evidence of gallstones or sonographic signs of acute cholecystitis.  No evidence of biliary ductal dilatation.  Coarsening of hepatic echotexture and mild capsular nodularity, consistent with history of hepatic cirrhosis. No liver mass or ascites visualized.   Electronically Signed   By: Myles Rosenthal M.D.   On: 02/08/2015 12:13   Dg Chest Port 1 View  02/26/2015   CLINICAL DATA:  Respiratory failure  EXAM: PORTABLE CHEST - 1 VIEW  COMPARISON:  Portable chest x-ray of February 24, 2015  FINDINGS: The right hemidiaphragm remains elevated as compared to the left. The pulmonary interstitial markings are more conspicuous bilaterally. The cardiac silhouette remains enlarged. There is no pleural effusion or pneumothorax. The feeding tube tip projects below the inferior margin of the image. The right-sided PICC line tip projects over the midportion of the SVC.  IMPRESSION: Interval deterioration in the appearance of the pulmonary interstitium which suggests mild CHF. There is stable cardiomegaly.   Electronically Signed   By: David  [place] M.D.   On: 02/26/2015 07:57   Dg Chest Port 1 View  02/24/2015   CLINICAL DATA:  PICC line placement  EXAM: PORTABLE CHEST - 1 VIEW  COMPARISON:  Portable exam 1658 hours compared to 02/22/2015  FINDINGS: Feeding tube extends into stomach.  RIGHT arm PICC line tip projects over cavoatrial junction.  Enlargement of cardiac silhouette.  Atherosclerotic calcification aorta.  Chronic elevation of RIGHT diaphragm.  Mild RIGHT basilar atelectasis.  Lungs otherwise clear.  No pleural effusion or pneumothorax.  Bones demineralized with posttraumatic deformity of the proximal RIGHT humerus.  IMPRESSION: Tip of RIGHT arm PICC line projects over cavoatrial junction.  Enlargement of cardiac silhouette.  RIGHT basilar atelectasis.   Electronically  Signed   By: Ulyses Southward M.D.   On: 02/24/2015 17:09   Dg Chest Port 1 View  02/22/2015   CLINICAL DATA:  Fevers  EXAM: PORTABLE CHEST - 1 VIEW  COMPARISON:  02/19/2015  FINDINGS: Cardiac shadow is stable. Elevation of the right hemidiaphragm is again seen. A feeding catheter is again noted in place. Chronic changes about the right shoulder joint are seen. No focal infiltrate or sizable effusion is noted.  IMPRESSION: No acute abnormality noted.   Electronically Signed   By: Alcide Clever M.D.   On: 02/22/2015 14:10   Dg Chest Port 1 View  02/19/2015   CLINICAL DATA:  Shortness of Breath  EXAM: PORTABLE CHEST - 1 VIEW  COMPARISON:  February 15, 2015  FINDINGS: Feeding tube tip is below the diaphragm. There is no edema or consolidation. Heart size and pulmonary vascularity are normal. No adenopathy. No pneumothorax. There is evidence of old trauma involving the proximal right humerus, stable.  IMPRESSION: No edema or consolidation.  Feeding tube tip is below the diaphragm.   Electronically Signed   By: Bretta Bang III M.D.   On: 02/19/2015 07:28   Dg Chest Port 1 View  02/15/2015   CLINICAL DATA:  Pneumonia  EXAM: PORTABLE CHEST - 1 VIEW  COMPARISON:  02/08/2015  FINDINGS: Right lower lobe airspace disease shows slight progression and may represent pneumonia. Small right pleural effusion has developed in the interval.  Cardiac enlargement without heart failure. Left lung is clear. Feeding tube enters the stomach with the tip not visualized. Advanced degenerative change in the right shoulder.  IMPRESSION: Progression of right lower lobe infiltrate and effusion, probable pneumonia   Electronically Signed   By: Marlan Palau M.D.   On: 02/15/2015 15:11   Dg Chest Port 1 View  02/05/2015   CLINICAL DATA:  COPD.  EXAM: PORTABLE CHEST - 1 VIEW  COMPARISON:  None.  FINDINGS: Feeding tube noted: Stomach. Mediastinum hilar structures are unremarkable. Mild cardiomegaly. Mild pulmonary venous congestion  interstitial prominence cannot be completely excluded. Very mild component congestive heart failure cannot be excluded. Pneumonitis cannot be excluded. No significant pleural effusion or pneumothorax. Degenerative changes both shoulders.  IMPRESSION: 1. Feeding tube noted in good anatomic position. 2. Very mild cardiomegaly with borderline pulmonary venous congestion and interstitial prominence. Mild congestive heart failure cannot be completely excluded. Mild pneumonitis cannot be excluded. Interstitial prominence may be chronic.   Electronically Signed   By: Maisie Fus  Register   On: 02/05/2015 07:36   Dg Abd Portable 1v  02/05/2015   CLINICAL DATA:  59 year old female with enteric tube placement.  EXAM: PORTABLE ABDOMEN - 1 VIEW  COMPARISON:  None.  FINDINGS: An enteric tube is partially visualized with tip to the right of the spine likely within the distal stomach. The tube appears to loop in the proximal portion of the stomache with tip extending distally. There is no evidence of bowel obstruction. For degenerative changes of spine.  IMPRESSION: Enteric tube with tip in distal stomach.   Electronically Signed   By: Elgie Collard M.D.   On: 02/05/2015 00:53    Labs:  CBC:  Recent Labs  02/25/15 0621 02/26/15 0704 02/27/15 0703 03/01/15 1210  WBC 27.1* 22.6* 18.0* 16.5*  HGB 8.8* 7.7* 8.0* 7.4*  HCT 27.0* 24.1* 25.6* 23.4*  PLT 480* 402* 366 350    COAGS: No results for input(s): INR, APTT in the last 8760 hours.  BMP:  Recent Labs  02/25/15 0621 02/26/15 0704 02/27/15 0703 03/01/15 1210  NA 133* 131* 131* 127*  K 4.4 4.4 3.8 4.2  CL 99* 98* 99* 92*  CO2 23 22 23 24   GLUCOSE 201* 170* 151* 477*  BUN 26* 28* 16 9  CALCIUM 9.1 8.8* 8.8* 8.1*  CREATININE 0.46 0.55 0.40* 0.41*  GFRNONAA >60 >60 >60 >60  GFRAA >60 >60 >60 >60    LIVER FUNCTION TESTS:  Recent Labs  02/08/15 1235 02/15/15 0612 02/23/15 0700 02/27/15 0703  BILITOT 0.6 0.4 0.3 0.4  AST 34 31 26 28     ALT 24 17 15 16   ALKPHOS 248* 205* 153* 151*  PROT 6.0* 6.6 7.4 5.8*  ALBUMIN 1.8* 1.8* 2.3* 1.9*    TUMOR MARKERS: No results for input(s): AFPTM, CEA, CA199, CHROMGRNA in the last 8760 hours.  Assessment and Plan:  Dysphagia with need for nutrition  C. Diff with fever and elevated WBC  Will recheck CBC tomorrow  Patient needs to be afebrile x 48 hours prior to placement of G-Tube  I attempted to contact the patients POA/Son today and was unable to reach him.  I have left an order on the chart to obtain consent for percutaneous G-tube placement.  Thank you for this interesting consult.  I greatly enjoyed meeting [facility] and look forward to participating in their care.  A copy of this report was sent to the requesting provider on this date.  Signed: Gwynneth Macleod PA-C 03/02/2015, 2:45 PM   I spent a total of 20 Minutes  in face to face in clinical consultation, greater than 50% of which was counseling/coordinating care for percutaneous gastrostomy tube placement.

## 2015-03-03 LAB — CBC WITH DIFFERENTIAL/PLATELET
BASOS ABS: 0.1 10*3/uL (ref 0.0–0.1)
Basophils Relative: 0 % (ref 0–1)
EOS PCT: 3 % (ref 0–5)
Eosinophils Absolute: 0.4 10*3/uL (ref 0.0–0.7)
HCT: 24.2 % — ABNORMAL LOW (ref 36.0–46.0)
Hemoglobin: 7.7 g/dL — ABNORMAL LOW (ref 12.0–15.0)
Lymphocytes Relative: 25 % (ref 12–46)
Lymphs Abs: 3.6 10*3/uL (ref 0.7–4.0)
MCH: 28.1 pg (ref 26.0–34.0)
MCHC: 31.8 g/dL (ref 30.0–36.0)
MCV: 88.3 fL (ref 78.0–100.0)
Monocytes Absolute: 1 10*3/uL (ref 0.1–1.0)
Monocytes Relative: 7 % (ref 3–12)
Neutro Abs: 9.5 10*3/uL — ABNORMAL HIGH (ref 1.7–7.7)
Neutrophils Relative %: 65 % (ref 43–77)
PLATELETS: 365 10*3/uL (ref 150–400)
RBC: 2.74 MIL/uL — ABNORMAL LOW (ref 3.87–5.11)
RDW: 15.6 % — ABNORMAL HIGH (ref 11.5–15.5)
WBC: 14.6 10*3/uL — AB (ref 4.0–10.5)

## 2015-03-04 LAB — CBC
HEMATOCRIT: 24 % — AB (ref 36.0–46.0)
Hemoglobin: 7.6 g/dL — ABNORMAL LOW (ref 12.0–15.0)
MCH: 28.5 pg (ref 26.0–34.0)
MCHC: 31.7 g/dL (ref 30.0–36.0)
MCV: 89.9 fL (ref 78.0–100.0)
PLATELETS: 330 10*3/uL (ref 150–400)
RBC: 2.67 MIL/uL — ABNORMAL LOW (ref 3.87–5.11)
RDW: 15.8 % — AB (ref 11.5–15.5)
WBC: 13.2 10*3/uL — ABNORMAL HIGH (ref 4.0–10.5)

## 2015-03-04 LAB — BASIC METABOLIC PANEL
Anion gap: 7 (ref 5–15)
BUN: 9 mg/dL (ref 6–20)
CALCIUM: 8.9 mg/dL (ref 8.9–10.3)
CO2: 26 mmol/L (ref 22–32)
Chloride: 105 mmol/L (ref 101–111)
Creatinine, Ser: 0.35 mg/dL — ABNORMAL LOW (ref 0.44–1.00)
GFR calc Af Amer: >60 mL/min (ref >60)
GLUCOSE: 116 mg/dL — AB (ref 65–99)
POTASSIUM: 4.1 mmol/L (ref 3.5–5.1)
Sodium: 138 mmol/L (ref 135–145)

## 2015-03-04 LAB — AMMONIA: Ammonia: 20 umol/L (ref 9–35)

## 2015-03-05 ENCOUNTER — Other Ambulatory Visit (HOSPITAL_COMMUNITY): Payer: Self-pay

## 2015-03-05 LAB — BASIC METABOLIC PANEL
Anion gap: 10 (ref 5–15)
BUN: 9 mg/dL (ref 6–20)
CHLORIDE: 101 mmol/L (ref 101–111)
CO2: 24 mmol/L (ref 22–32)
CREATININE: 0.34 mg/dL — AB (ref 0.44–1.00)
Calcium: 8.8 mg/dL — ABNORMAL LOW (ref 8.9–10.3)
GFR calc non Af Amer: >60 mL/min (ref >60)
Glucose, Bld: 96 mg/dL (ref 65–99)
POTASSIUM: 4.2 mmol/L (ref 3.5–5.1)
SODIUM: 135 mmol/L (ref 135–145)

## 2015-03-05 LAB — URINE CULTURE

## 2015-03-05 LAB — IRON AND TIBC
IRON: 34 ug/dL (ref 28–170)
SATURATION RATIOS: 17 % (ref 10.4–31.8)
TIBC: 203 ug/dL — AB (ref 250–450)
UIBC: 169 ug/dL

## 2015-03-05 LAB — CBC
HCT: 23.8 % — ABNORMAL LOW (ref 36.0–46.0)
HEMOGLOBIN: 7.3 g/dL — AB (ref 12.0–15.0)
MCH: 27.5 pg (ref 26.0–34.0)
MCHC: 30.7 g/dL (ref 30.0–36.0)
MCV: 89.8 fL (ref 78.0–100.0)
Platelets: 343 10*3/uL (ref 150–400)
RBC: 2.65 MIL/uL — ABNORMAL LOW (ref 3.87–5.11)
RDW: 16 % — ABNORMAL HIGH (ref 11.5–15.5)
WBC: 13.7 10*3/uL — ABNORMAL HIGH (ref 4.0–10.5)

## 2015-03-05 LAB — FERRITIN: FERRITIN: 961 ng/mL — AB (ref 11–307)

## 2015-03-05 LAB — PROCALCITONIN: PROCALCITONIN: 0.28 ng/mL

## 2015-03-05 LAB — PROTIME-INR
INR: 1.06 (ref 0.00–1.49)
PROTHROMBIN TIME: 14 s (ref 11.6–15.2)

## 2015-03-05 LAB — APTT: aPTT: 33 seconds (ref 24–37)

## 2015-03-05 MED ORDER — FENTANYL CITRATE (PF) 100 MCG/2ML IJ SOLN
INTRAMUSCULAR | Status: AC | PRN
  Administered 2015-03-05: 25 ug via INTRAVENOUS
  Administered 2015-03-05: 50 ug via INTRAVENOUS

## 2015-03-05 MED ORDER — MIDAZOLAM HCL 2 MG/2ML IJ SOLN
INTRAMUSCULAR | Status: AC | PRN
  Administered 2015-03-05: 0.5 mg via INTRAVENOUS

## 2015-03-05 MED ORDER — MIDAZOLAM HCL 2 MG/2ML IJ SOLN
INTRAMUSCULAR | Status: AC
  Filled 2015-03-05: qty 2

## 2015-03-05 MED ORDER — FENTANYL CITRATE (PF) 100 MCG/2ML IJ SOLN
INTRAMUSCULAR | Status: AC
  Filled 2015-03-05: qty 2

## 2015-03-05 MED ORDER — GLUCAGON HCL RDNA (DIAGNOSTIC) 1 MG IJ SOLR
INTRAMUSCULAR | Status: AC
  Filled 2015-03-05: qty 1

## 2015-03-05 NOTE — Procedures (Signed)
22F gastrostomy tube placement under fluoro No complication No blood loss. See complete dictation in [facility] PACS.  

## 2015-03-06 LAB — HEMOGLOBIN AND HEMATOCRIT, BLOOD
HEMATOCRIT: 23 % — AB (ref 36.0–46.0)
Hemoglobin: 7.4 g/dL — ABNORMAL LOW (ref 12.0–15.0)

## 2015-03-07 LAB — CULTURE, BLOOD (ROUTINE X 2)
CULTURE: NO GROWTH
Culture: NO GROWTH

## 2015-03-07 LAB — BASIC METABOLIC PANEL
Anion gap: 9 (ref 5–15)
BUN: 6 mg/dL (ref 6–20)
CO2: 24 mmol/L (ref 22–32)
CREATININE: 0.42 mg/dL — AB (ref 0.44–1.00)
Calcium: 8.5 mg/dL — ABNORMAL LOW (ref 8.9–10.3)
Chloride: 100 mmol/L — ABNORMAL LOW (ref 101–111)
Glucose, Bld: 144 mg/dL — ABNORMAL HIGH (ref 65–99)
POTASSIUM: 3.8 mmol/L (ref 3.5–5.1)
SODIUM: 133 mmol/L — AB (ref 135–145)

## 2015-03-07 LAB — C-REACTIVE PROTEIN: CRP: 20 mg/dL — AB (ref <1.0)

## 2015-03-07 LAB — CBC WITH DIFFERENTIAL/PLATELET
BASOS PCT: 0 % (ref 0–1)
Basophils Absolute: 0 10*3/uL (ref 0.0–0.1)
EOS ABS: 0.2 10*3/uL (ref 0.0–0.7)
EOS PCT: 1 % (ref 0–5)
HCT: 22.4 % — ABNORMAL LOW (ref 36.0–46.0)
HEMOGLOBIN: 7.1 g/dL — AB (ref 12.0–15.0)
LYMPHS ABS: 2.5 10*3/uL (ref 0.7–4.0)
Lymphocytes Relative: 14 % (ref 12–46)
MCH: 28.1 pg (ref 26.0–34.0)
MCHC: 31.7 g/dL (ref 30.0–36.0)
MCV: 88.5 fL (ref 78.0–100.0)
Monocytes Absolute: 1.1 10*3/uL — ABNORMAL HIGH (ref 0.1–1.0)
Monocytes Relative: 7 % (ref 3–12)
NEUTROS PCT: 78 % — AB (ref 43–77)
Neutro Abs: 13.3 10*3/uL — ABNORMAL HIGH (ref 1.7–7.7)
PLATELETS: 317 10*3/uL (ref 150–400)
RBC: 2.53 MIL/uL — AB (ref 3.87–5.11)
RDW: 15.8 % — ABNORMAL HIGH (ref 11.5–15.5)
WBC: 17.1 10*3/uL — AB (ref 4.0–10.5)

## 2015-03-07 LAB — SEDIMENTATION RATE

## 2015-03-07 LAB — PREPARE RBC (CROSSMATCH)

## 2015-03-07 LAB — PROCALCITONIN: Procalcitonin: <0.1 ng/mL

## 2015-03-07 LAB — ABO/RH: ABO/RH(D): O POS

## 2015-03-08 ENCOUNTER — Encounter: Payer: Self-pay | Admitting: Radiology

## 2015-03-08 ENCOUNTER — Other Ambulatory Visit (HOSPITAL_COMMUNITY): Payer: Self-pay

## 2015-03-08 LAB — TYPE AND SCREEN
ABO/RH(D): O POS
Antibody Screen: NEGATIVE
UNIT DIVISION: 0

## 2015-03-08 LAB — CBC WITH DIFFERENTIAL/PLATELET
Basophils Absolute: 0 10*3/uL (ref 0.0–0.1)
Basophils Relative: 0 % (ref 0–1)
EOS ABS: 0.3 10*3/uL (ref 0.0–0.7)
EOS PCT: 2 % (ref 0–5)
HCT: 26.1 % — ABNORMAL LOW (ref 36.0–46.0)
Hemoglobin: 8.3 g/dL — ABNORMAL LOW (ref 12.0–15.0)
LYMPHS ABS: 1.9 10*3/uL (ref 0.7–4.0)
Lymphocytes Relative: 14 % (ref 12–46)
MCH: 27.7 pg (ref 26.0–34.0)
MCHC: 31.8 g/dL (ref 30.0–36.0)
MCV: 87 fL (ref 78.0–100.0)
MONO ABS: 0.8 10*3/uL (ref 0.1–1.0)
MONOS PCT: 6 % (ref 3–12)
Neutro Abs: 10.6 10*3/uL — ABNORMAL HIGH (ref 1.7–7.7)
Neutrophils Relative %: 78 % — ABNORMAL HIGH (ref 43–77)
PLATELETS: 301 10*3/uL (ref 150–400)
RBC: 3 MIL/uL — ABNORMAL LOW (ref 3.87–5.11)
RDW: 16.7 % — AB (ref 11.5–15.5)
WBC: 13.7 10*3/uL — ABNORMAL HIGH (ref 4.0–10.5)

## 2015-03-08 LAB — COMPREHENSIVE METABOLIC PANEL
ALK PHOS: 143 U/L — AB (ref 38–126)
ALT: 11 U/L — ABNORMAL LOW (ref 14–54)
ANION GAP: 9 (ref 5–15)
AST: 19 U/L (ref 15–41)
Albumin: 1.8 g/dL — ABNORMAL LOW (ref 3.5–5.0)
BUN: 6 mg/dL (ref 6–20)
CALCIUM: 8.7 mg/dL — AB (ref 8.9–10.3)
CHLORIDE: 99 mmol/L — AB (ref 101–111)
CO2: 26 mmol/L (ref 22–32)
Creatinine, Ser: 0.37 mg/dL — ABNORMAL LOW (ref 0.44–1.00)
GFR calc non Af Amer: >60 mL/min (ref >60)
Glucose, Bld: 140 mg/dL — ABNORMAL HIGH (ref 65–99)
POTASSIUM: 4 mmol/L (ref 3.5–5.1)
SODIUM: 134 mmol/L — AB (ref 135–145)
Total Bilirubin: 0.6 mg/dL (ref 0.3–1.2)
Total Protein: 6.3 g/dL — ABNORMAL LOW (ref 6.5–8.1)

## 2015-03-08 LAB — URINE CULTURE

## 2015-03-08 MED ORDER — IOHEXOL 300 MG/ML  SOLN
100.0000 mL | Freq: Once | INTRAMUSCULAR | Status: AC | PRN
  Administered 2015-03-08: 100 mL via INTRAVENOUS

## 2015-03-10 LAB — CULTURE, BLOOD (ROUTINE X 2)
CULTURE: NO GROWTH
Culture: NO GROWTH

## 2015-03-11 LAB — URINALYSIS, ROUTINE W REFLEX MICROSCOPIC
BILIRUBIN URINE: NEGATIVE
GLUCOSE, UA: NEGATIVE mg/dL
HGB URINE DIPSTICK: NEGATIVE
Ketones, ur: NEGATIVE mg/dL
Nitrite: NEGATIVE
Protein, ur: NEGATIVE mg/dL
SPECIFIC GRAVITY, URINE: 1.009 (ref 1.005–1.030)
UROBILINOGEN UA: 0.2 mg/dL (ref 0.0–1.0)
pH: 8 (ref 5.0–8.0)

## 2015-03-11 LAB — URINE MICROSCOPIC-ADD ON

## 2015-03-12 LAB — BASIC METABOLIC PANEL
Anion gap: 9 (ref 5–15)
BUN: 8 mg/dL (ref 6–20)
CALCIUM: 9.4 mg/dL (ref 8.9–10.3)
CHLORIDE: 100 mmol/L — AB (ref 101–111)
CO2: 23 mmol/L (ref 22–32)
CREATININE: 0.36 mg/dL — AB (ref 0.44–1.00)
GFR calc Af Amer: >60 mL/min (ref >60)
GFR calc non Af Amer: >60 mL/min (ref >60)
GLUCOSE: 124 mg/dL — AB (ref 65–99)
Potassium: 5.2 mmol/L — ABNORMAL HIGH (ref 3.5–5.1)
Sodium: 132 mmol/L — ABNORMAL LOW (ref 135–145)

## 2015-03-12 LAB — CBC WITH DIFFERENTIAL/PLATELET
BASOS ABS: 0 10*3/uL (ref 0.0–0.1)
BASOS PCT: 0 % (ref 0–1)
Basophils Absolute: 0 10*3/uL (ref 0.0–0.1)
Basophils Relative: 0 % (ref 0–1)
EOS ABS: 0.2 10*3/uL (ref 0.0–0.7)
EOS PCT: 4 % (ref 0–5)
Eosinophils Absolute: 0.5 10*3/uL (ref 0.0–0.7)
Eosinophils Relative: 2 % (ref 0–5)
HCT: 24.7 % — ABNORMAL LOW (ref 36.0–46.0)
HEMATOCRIT: 31.7 % — AB (ref 36.0–46.0)
HEMOGLOBIN: 7.9 g/dL — AB (ref 12.0–15.0)
Hemoglobin: 10 g/dL — ABNORMAL LOW (ref 12.0–15.0)
LYMPHS ABS: 1.6 10*3/uL (ref 0.7–4.0)
LYMPHS ABS: 2.9 10*3/uL (ref 0.7–4.0)
LYMPHS PCT: 26 % (ref 12–46)
Lymphocytes Relative: 17 % (ref 12–46)
MCH: 25 pg — AB (ref 26.0–34.0)
MCH: 27.4 pg (ref 26.0–34.0)
MCHC: 32 g/dL (ref 30.0–36.0)
MCHC: 31.5 g/dL (ref 30.0–36.0)
MCV: 78.2 fL (ref 78.0–100.0)
MCV: 86.8 fL (ref 78.0–100.0)
MONO ABS: 0.9 10*3/uL (ref 0.1–1.0)
Monocytes Absolute: 0.5 10*3/uL (ref 0.1–1.0)
Monocytes Relative: 6 % (ref 3–12)
Monocytes Relative: 8 % (ref 3–12)
NEUTROS ABS: 6.9 10*3/uL (ref 1.7–7.7)
NEUTROS PCT: 75 % (ref 43–77)
Neutro Abs: 6.7 10*3/uL (ref 1.7–7.7)
Neutrophils Relative %: 62 % (ref 43–77)
PLATELETS: 472 10*3/uL — AB (ref 150–400)
Platelets: 195 10*3/uL (ref 150–400)
RBC: 3.16 MIL/uL — AB (ref 3.87–5.11)
RBC: 3.65 MIL/uL — AB (ref 3.87–5.11)
RDW: 18.6 % — ABNORMAL HIGH (ref 11.5–15.5)
RDW: 15.1 % (ref 11.5–15.5)
WBC: 9 10*3/uL (ref 4.0–10.5)
WBC: 11.2 10*3/uL — AB (ref 4.0–10.5)

## 2015-03-12 LAB — PHOSPHORUS: Phosphorus: 5.4 mg/dL — ABNORMAL HIGH (ref 2.5–4.6)

## 2015-03-12 LAB — COMPREHENSIVE METABOLIC PANEL
ALBUMIN: 2.3 g/dL — AB (ref 3.5–5.0)
ALK PHOS: 113 U/L (ref 38–126)
ALT: 33 U/L (ref 14–54)
AST: 41 U/L (ref 15–41)
Anion gap: 11 (ref 5–15)
BUN: 63 mg/dL — ABNORMAL HIGH (ref 6–20)
CALCIUM: 8.8 mg/dL — AB (ref 8.9–10.3)
CO2: 29 mmol/L (ref 22–32)
CREATININE: 2.62 mg/dL — AB (ref 0.44–1.00)
Chloride: 95 mmol/L — ABNORMAL LOW (ref 101–111)
GFR calc Af Amer: 22 mL/min — ABNORMAL LOW (ref >60)
GFR calc non Af Amer: 19 mL/min — ABNORMAL LOW (ref >60)
GLUCOSE: 120 mg/dL — AB (ref 65–99)
Potassium: 3 mmol/L — ABNORMAL LOW (ref 3.5–5.1)
SODIUM: 135 mmol/L (ref 135–145)
Total Bilirubin: 0.4 mg/dL (ref 0.3–1.2)
Total Protein: 7.4 g/dL (ref 6.5–8.1)

## 2015-03-12 LAB — URINE CULTURE: Culture: NO GROWTH

## 2015-03-12 LAB — MAGNESIUM: Magnesium: 1.8 mg/dL (ref 1.7–2.4)

## 2015-03-13 LAB — CBC WITH DIFFERENTIAL/PLATELET
BASOS ABS: 0.1 10*3/uL (ref 0.0–0.1)
Basophils Relative: 1 % (ref 0–1)
EOS PCT: 6 % — AB (ref 0–5)
Eosinophils Absolute: 0.6 10*3/uL (ref 0.0–0.7)
HCT: 28.3 % — ABNORMAL LOW (ref 36.0–46.0)
Hemoglobin: 9 g/dL — ABNORMAL LOW (ref 12.0–15.0)
LYMPHS PCT: 28 % (ref 12–46)
Lymphs Abs: 2.9 10*3/uL (ref 0.7–4.0)
MCH: 27.4 pg (ref 26.0–34.0)
MCHC: 31.8 g/dL (ref 30.0–36.0)
MCV: 86 fL (ref 78.0–100.0)
MONO ABS: 0.6 10*3/uL (ref 0.1–1.0)
Monocytes Relative: 6 % (ref 3–12)
Neutro Abs: 6.1 10*3/uL (ref 1.7–7.7)
Neutrophils Relative %: 59 % (ref 43–77)
PLATELETS: 495 10*3/uL — AB (ref 150–400)
RBC: 3.29 MIL/uL — ABNORMAL LOW (ref 3.87–5.11)
RDW: 14.9 % (ref 11.5–15.5)
WBC: 10.3 10*3/uL (ref 4.0–10.5)

## 2015-03-13 LAB — COMPREHENSIVE METABOLIC PANEL
ALT: 11 U/L — ABNORMAL LOW (ref 14–54)
ANION GAP: 7 (ref 5–15)
AST: 20 U/L (ref 15–41)
Albumin: 2.2 g/dL — ABNORMAL LOW (ref 3.5–5.0)
Alkaline Phosphatase: 134 U/L — ABNORMAL HIGH (ref 38–126)
BUN: 8 mg/dL (ref 6–20)
CHLORIDE: 99 mmol/L — AB (ref 101–111)
CO2: 27 mmol/L (ref 22–32)
Calcium: 9.3 mg/dL (ref 8.9–10.3)
Creatinine, Ser: 0.38 mg/dL — ABNORMAL LOW (ref 0.44–1.00)
Glucose, Bld: 113 mg/dL — ABNORMAL HIGH (ref 65–99)
POTASSIUM: 4.5 mmol/L (ref 3.5–5.1)
Sodium: 133 mmol/L — ABNORMAL LOW (ref 135–145)
Total Bilirubin: 0.3 mg/dL (ref 0.3–1.2)
Total Protein: 6.9 g/dL (ref 6.5–8.1)

## 2015-03-13 LAB — MAGNESIUM: MAGNESIUM: 1.8 mg/dL (ref 1.7–2.4)

## 2015-03-13 LAB — PHOSPHORUS: PHOSPHORUS: 5.1 mg/dL — AB (ref 2.5–4.6)

## 2015-03-16 LAB — BASIC METABOLIC PANEL
Anion gap: 9 (ref 5–15)
BUN: 14 mg/dL (ref 6–20)
CO2: 27 mmol/L (ref 22–32)
CREATININE: 0.31 mg/dL — AB (ref 0.44–1.00)
Calcium: 8 mg/dL — ABNORMAL LOW (ref 8.9–10.3)
Chloride: 96 mmol/L — ABNORMAL LOW (ref 101–111)
Glucose, Bld: 108 mg/dL — ABNORMAL HIGH (ref 65–99)
Potassium: 5.2 mmol/L — ABNORMAL HIGH (ref 3.5–5.1)
SODIUM: 132 mmol/L — AB (ref 135–145)

## 2015-03-16 LAB — CBC WITH DIFFERENTIAL/PLATELET
BASOS PCT: 0 % (ref 0–1)
Basophils Absolute: 0 10*3/uL (ref 0.0–0.1)
EOS ABS: 0.7 10*3/uL (ref 0.0–0.7)
EOS PCT: 7 % — AB (ref 0–5)
HCT: 29.2 % — ABNORMAL LOW (ref 36.0–46.0)
HEMOGLOBIN: 9.1 g/dL — AB (ref 12.0–15.0)
LYMPHS ABS: 3.7 10*3/uL (ref 0.7–4.0)
Lymphocytes Relative: 38 % (ref 12–46)
MCH: 27.2 pg (ref 26.0–34.0)
MCHC: 31.2 g/dL (ref 30.0–36.0)
MCV: 87.2 fL (ref 78.0–100.0)
Monocytes Absolute: 0.6 10*3/uL (ref 0.1–1.0)
Monocytes Relative: 7 % (ref 3–12)
NEUTROS PCT: 48 % (ref 43–77)
Neutro Abs: 4.6 10*3/uL (ref 1.7–7.7)
PLATELETS: 452 10*3/uL — AB (ref 150–400)
RBC: 3.35 MIL/uL — AB (ref 3.87–5.11)
RDW: 14.7 % (ref 11.5–15.5)
WBC: 9.7 10*3/uL (ref 4.0–10.5)

## 2015-03-16 LAB — MAGNESIUM: MAGNESIUM: 1.4 mg/dL — AB (ref 1.7–2.4)

## 2015-03-16 LAB — PHOSPHORUS: PHOSPHORUS: 5 mg/dL — AB (ref 2.5–4.6)

## 2015-03-17 LAB — BASIC METABOLIC PANEL
ANION GAP: 8 (ref 5–15)
BUN: 11 mg/dL (ref 6–20)
CHLORIDE: 99 mmol/L — AB (ref 101–111)
CO2: 29 mmol/L (ref 22–32)
Calcium: 9.5 mg/dL (ref 8.9–10.3)
Creatinine, Ser: 0.35 mg/dL — ABNORMAL LOW (ref 0.44–1.00)
GFR calc non Af Amer: >60 mL/min (ref >60)
Glucose, Bld: 125 mg/dL — ABNORMAL HIGH (ref 65–99)
POTASSIUM: 4.1 mmol/L (ref 3.5–5.1)
SODIUM: 136 mmol/L (ref 135–145)

## 2015-03-17 LAB — PHOSPHORUS: PHOSPHORUS: 5.3 mg/dL — AB (ref 2.5–4.6)

## 2015-03-17 LAB — MAGNESIUM: MAGNESIUM: 1.6 mg/dL — AB (ref 1.7–2.4)

## 2016-11-29 IMAGING — US US ABDOMEN COMPLETE
1 series · 13 of 25 positions shown · non-contrast
Comparison: None.

CLINICAL DATA: Hepatic cirrhosis.

EXAM:
ULTRASOUND ABDOMEN COMPLETE

[Series 1: us abdomen complete · 0.18mm/px · 13 of 59 slices shown]
[im 1/59]
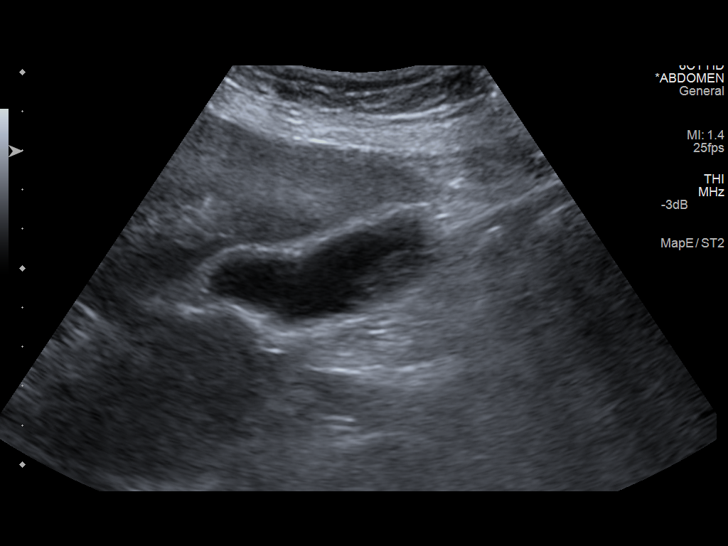
[im 5/59]
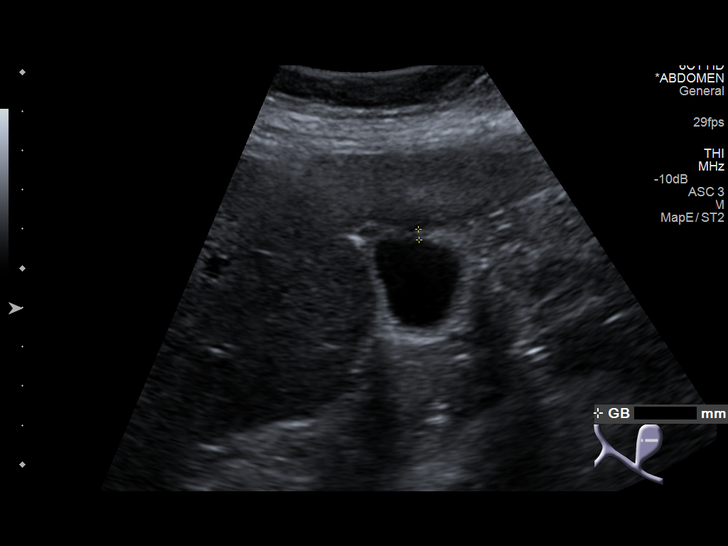
[im 10/59]
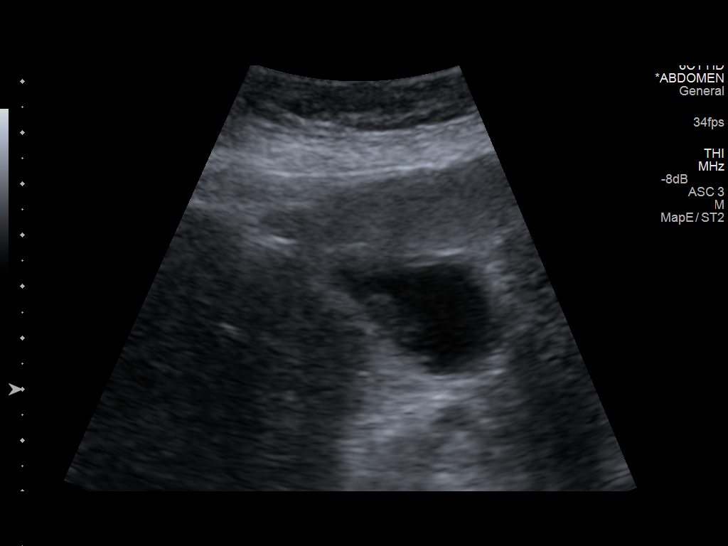
[im 15/59]
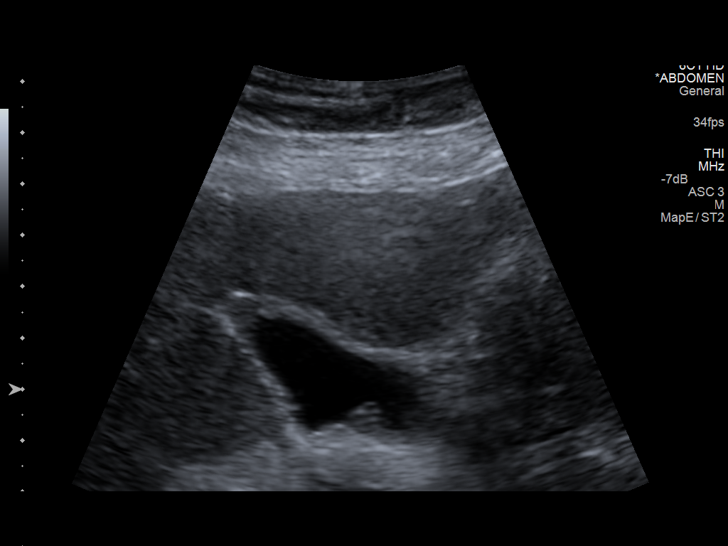
[im 20/59]
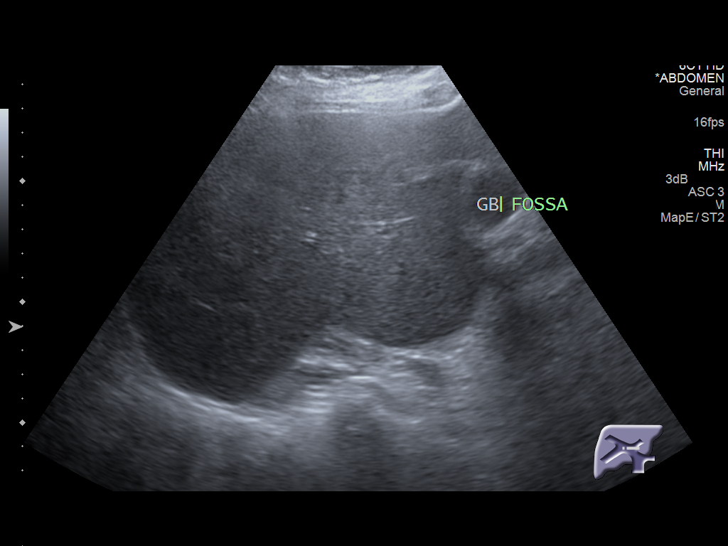
[im 25/59]
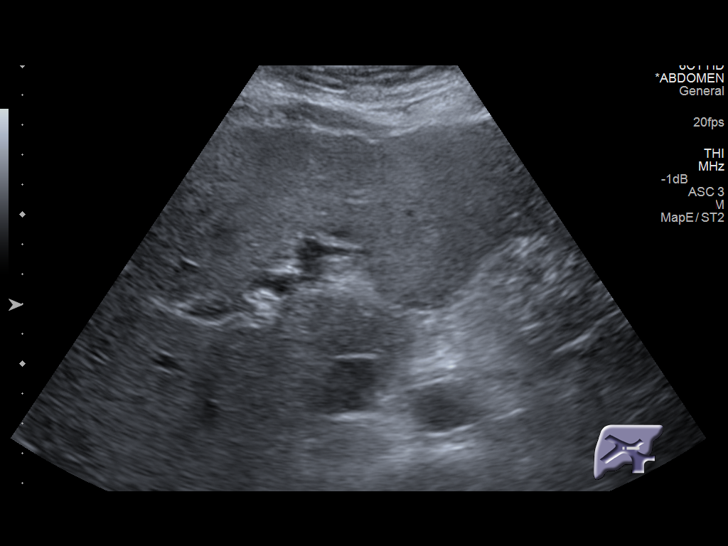
[im 30/59]
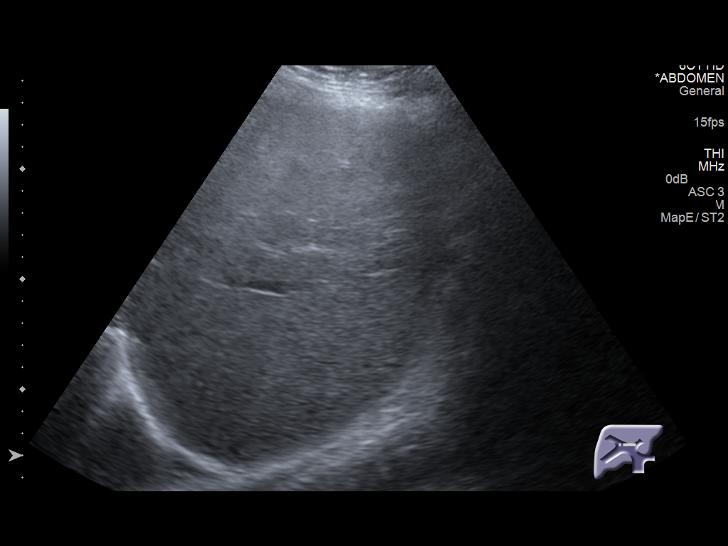
[im 34/59]
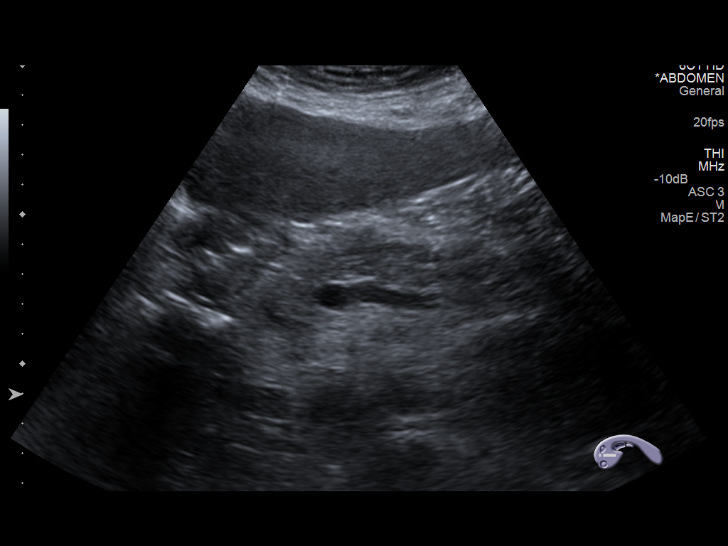
[im 39/59]
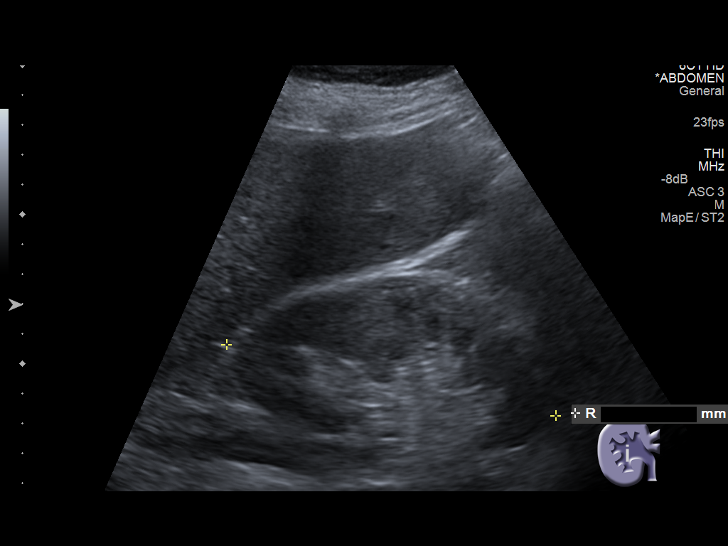
[im 44/59]
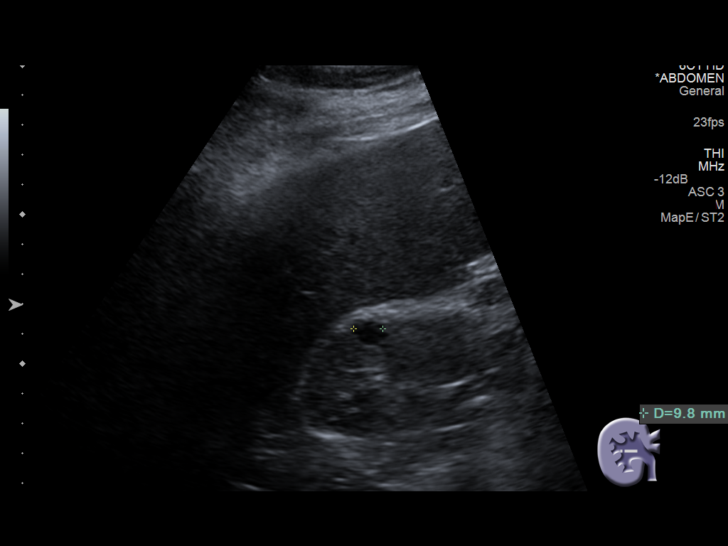
[im 49/59]
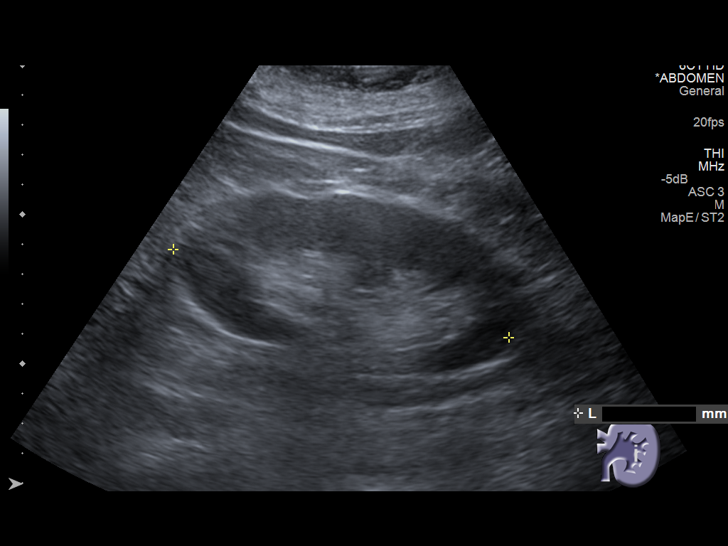
[im 54/59]
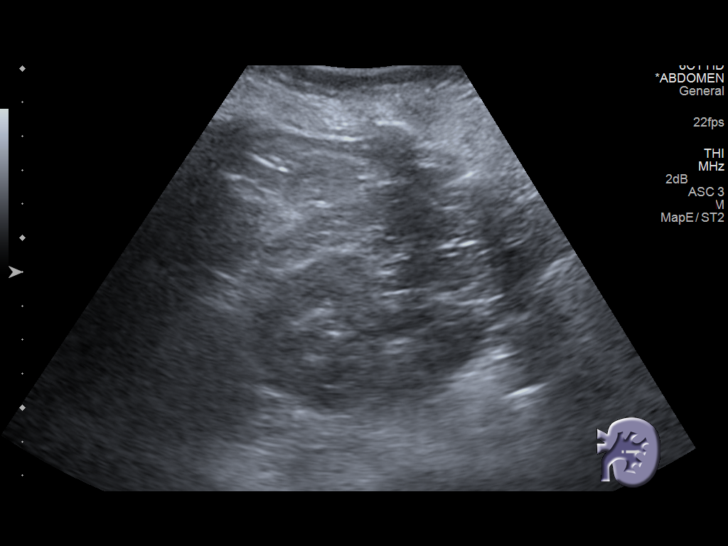
[im 59/59]
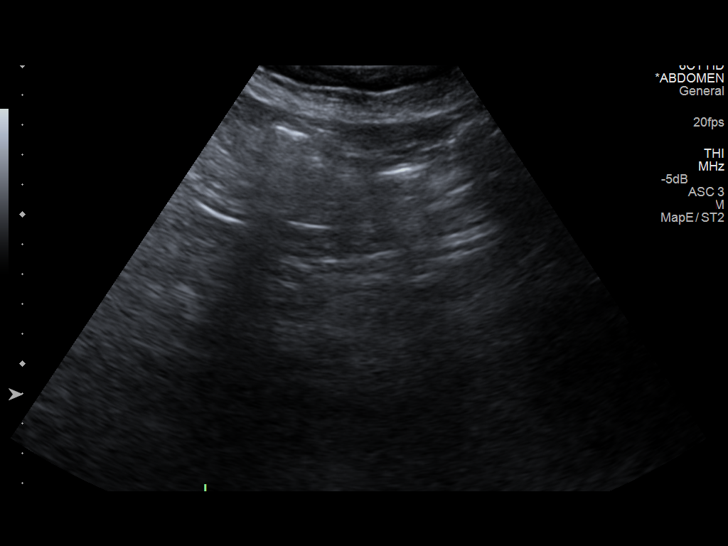

[13 of 25 positions shown; findings below may reference images not displayed]

FINDINGS: Gallbladder: A small amount of layering sludge is noted within the
gallbladder. No discrete gallstones are seen. A 5 mm nonmobile
nodular density is seen along the dependent gallbladder wall which
shows no evidence of acoustic shadowing. This could represent a
small polyp or tumefactive sludge. No evidence of gallbladder wall
thickening or pericholecystic fluid. No sonographic Murphy sign
noted by sonographer.

Common bile duct: Diameter: 3 mm, within normal limits.

Liver: No focal lesion identified. Coarsening of hepatic echotexture
and mild nodularity of capsular contour seen, consistent with
history of hepatic cirrhosis. No evidence of ascites.

IVC: No abnormality visualized.

Pancreas: Visualized portion unremarkable.

Spleen: Size and appearance within normal limits.

Right Kidney: Length: 11.3 cm. Echogenicity within normal limits.
Tiny sub-cm cyst noted in upper pole. No mass or hydronephrosis
visualized.

Left Kidney: Length: 11.6 cm. Echogenicity within normal limits. No
mass or hydronephrosis visualized.

Abdominal aorta: No aneurysm visualized.

Other findings: None.
IMPRESSION: Gallbladder sludge, with possible 5 mm gallbladder polyp versus
tumefactive sludge. No evidence of gallstones or sonographic signs
of acute cholecystitis.

No evidence of biliary ductal dilatation.

Coarsening of hepatic echotexture and mild capsular nodularity,
consistent with history of hepatic cirrhosis. No liver mass or
ascites visualized.

## 2017-03-08 IMAGING — CR DG CHEST 1V PORT
1 series · 1 of 1 positions shown · non-contrast
Comparison: February 15, 2015

CLINICAL DATA: Shortness of Breath

EXAM:
PORTABLE CHEST - 1 VIEW

[AP]
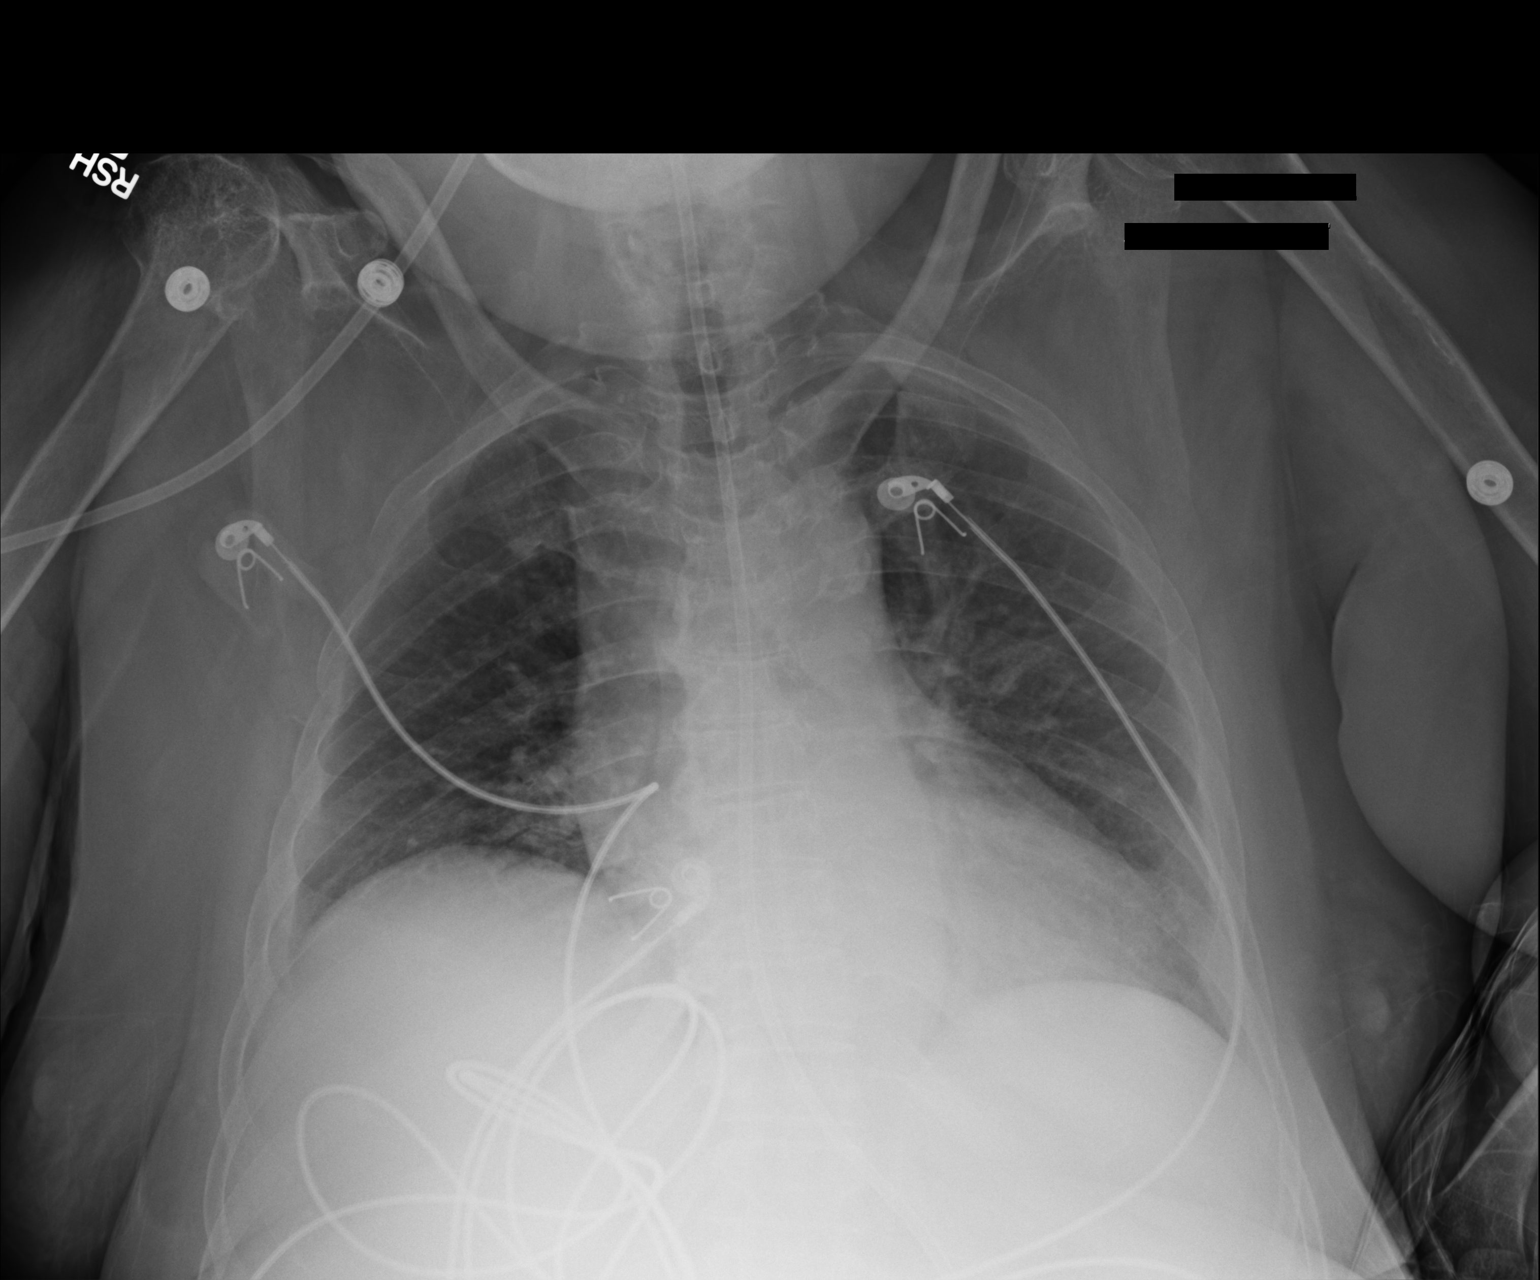

[1 of 1 positions shown; findings below may reference images not displayed]

FINDINGS: Feeding tube tip is below the diaphragm. There is no edema or
consolidation. Heart size and pulmonary vascularity are normal. No
adenopathy. No pneumothorax. There is evidence of old trauma
involving the proximal right humerus, stable.
IMPRESSION: No edema or consolidation.  Feeding tube tip is below the diaphragm.

## 2017-03-25 IMAGING — CT CT ABD-PELV W/ CM
2 of 5 series · 15 of 36 positions shown, 18 images · IV contrast (Omni 300)
Comparison: None.

CLINICAL DATA: Fever of unknown origin

EXAM:
CT CHEST, ABDOMEN, AND PELVIS WITH CONTRAST
TECHNIQUE: Multidetector CT imaging of the chest, abdomen and pelvis was
performed following the standard protocol during bolus
administration of intravenous contrast.
CONTRAST:  100mL OMNIPAQUE IOHEXOL 300 MG/ML  SOLN

[Series 2: cap 5.0 i31f 1 · axial · 0.95mm/px · z∈[+691,+1166]mm · 12 of 109 slices shown, 15 images]
[im 7/109  mediastinal]
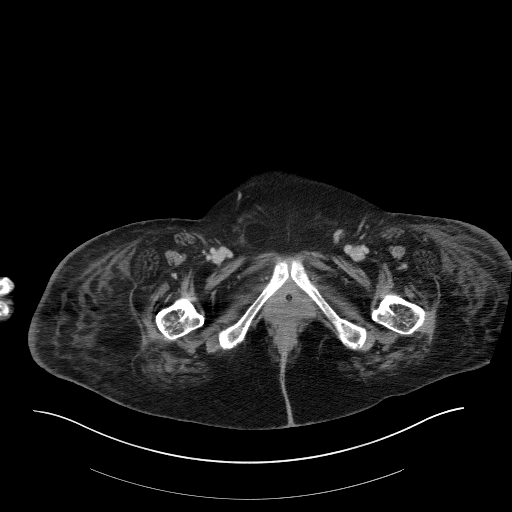
[im 7/109  lung]
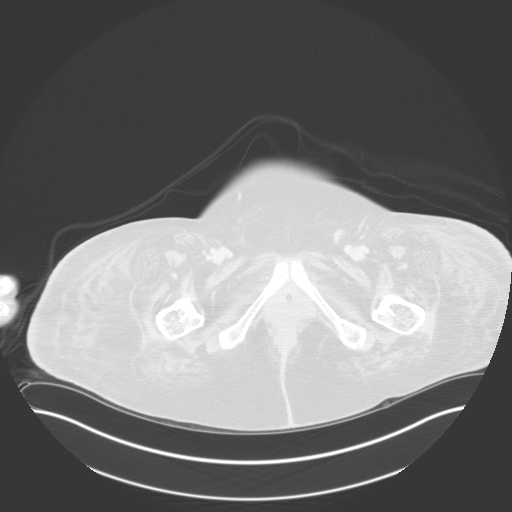
[im 14/109  lung]
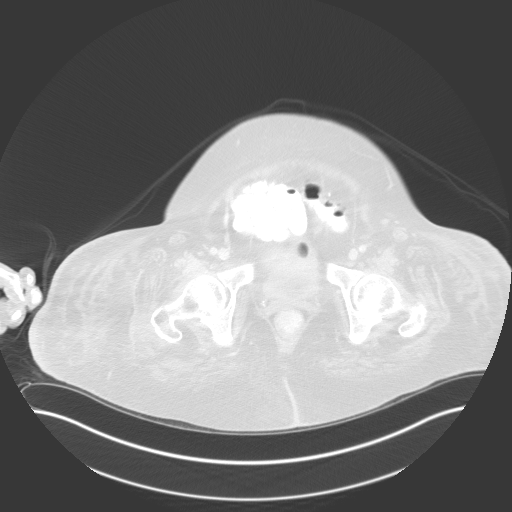
[im 28/109  lung]
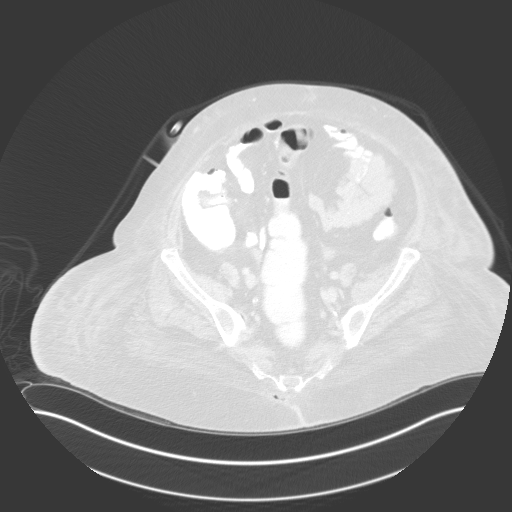
[im 34/109  lung]
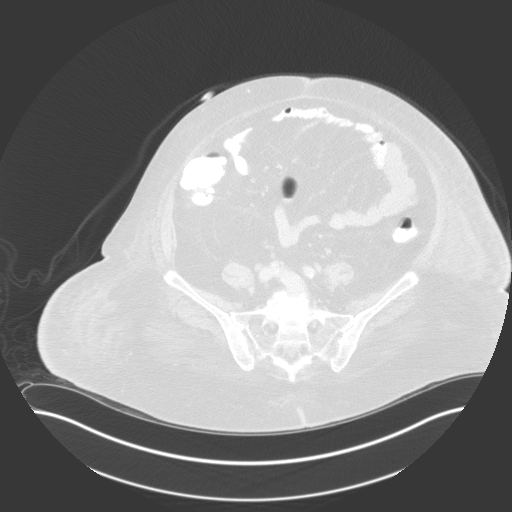
[im 41/109  mediastinal]
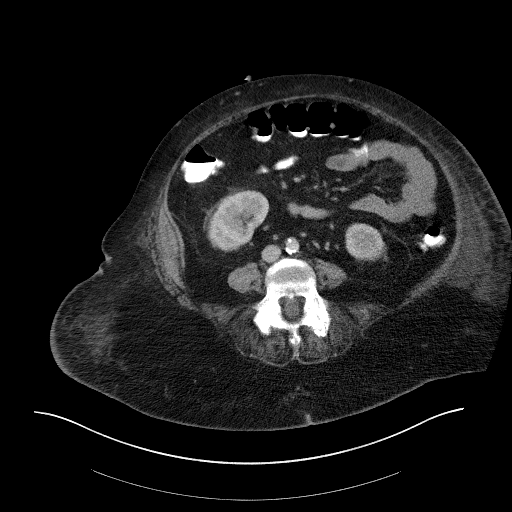
[im 41/109  lung]
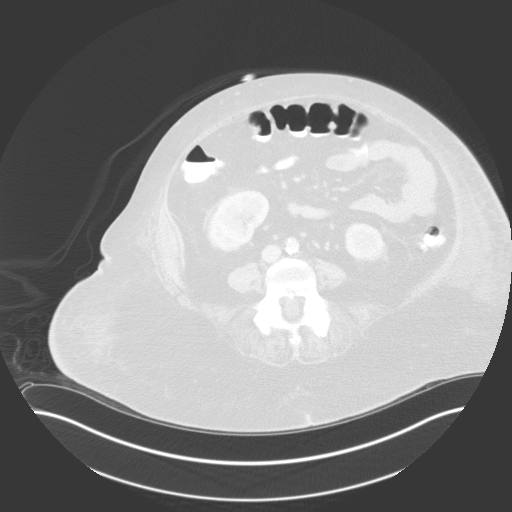
[im 48/109  lung]
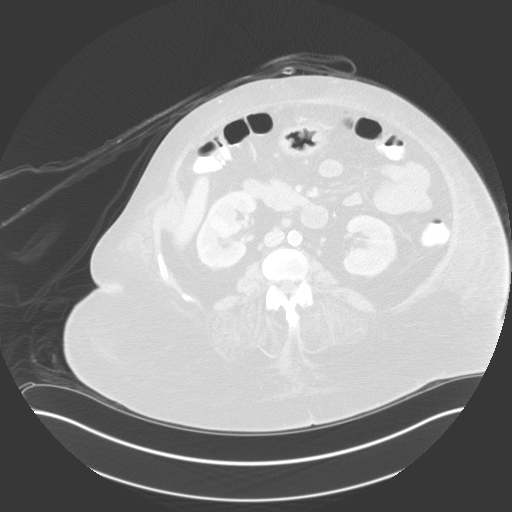
[im 61/109  lung]
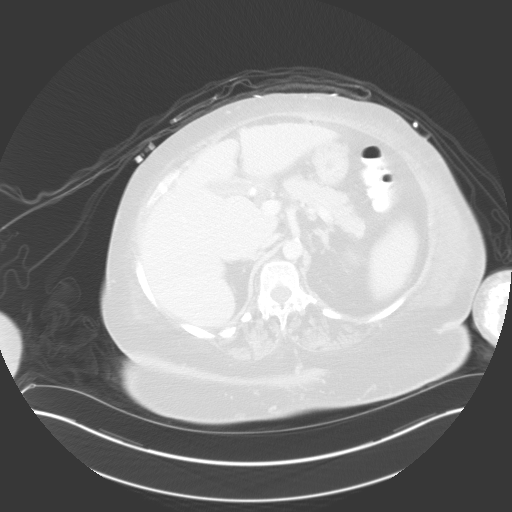
[im 68/109  lung]
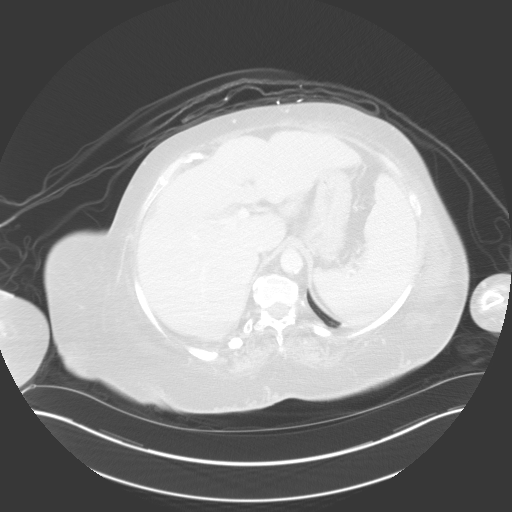
[im 75/109  mediastinal]
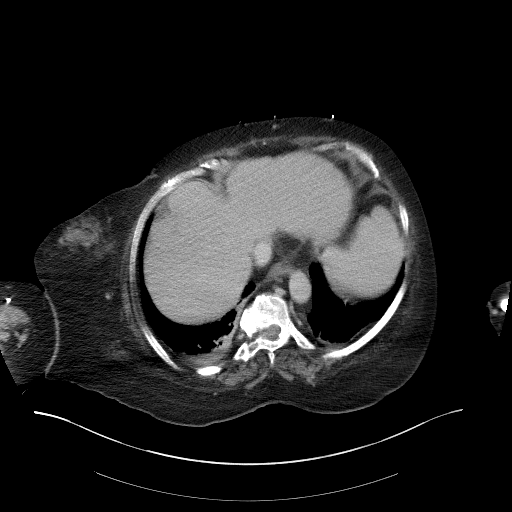
[im 75/109  lung]
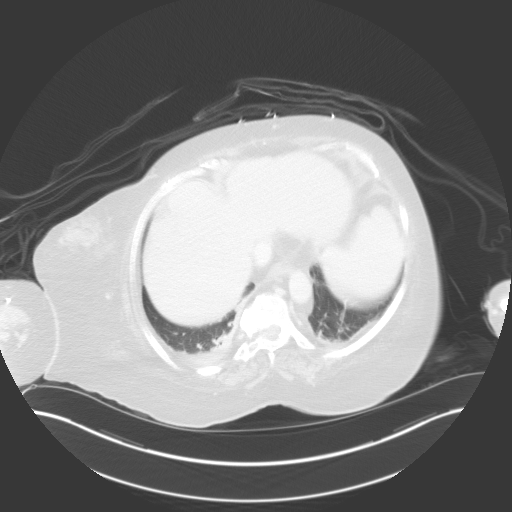
[im 82/109  lung]
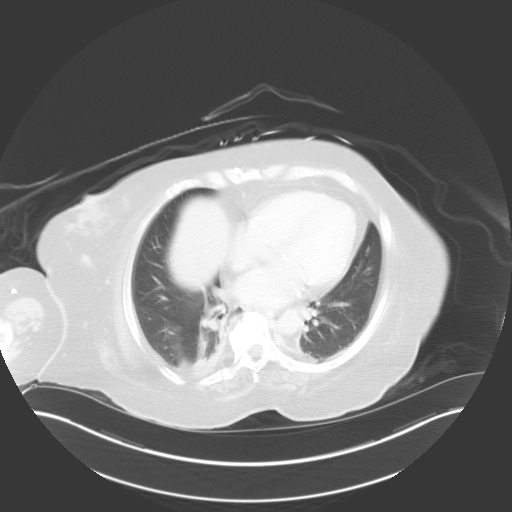
[im 95/109  lung]
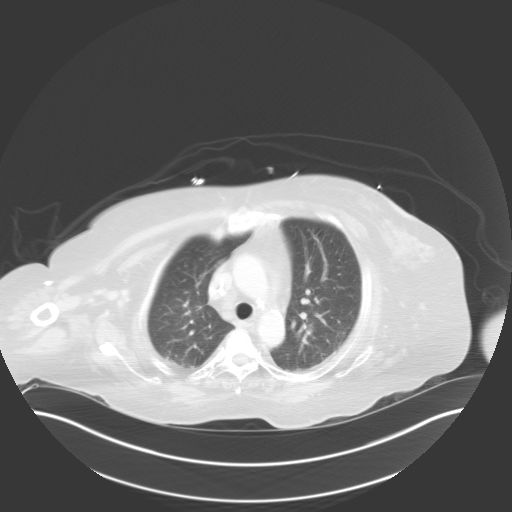
[im 102/109  lung]
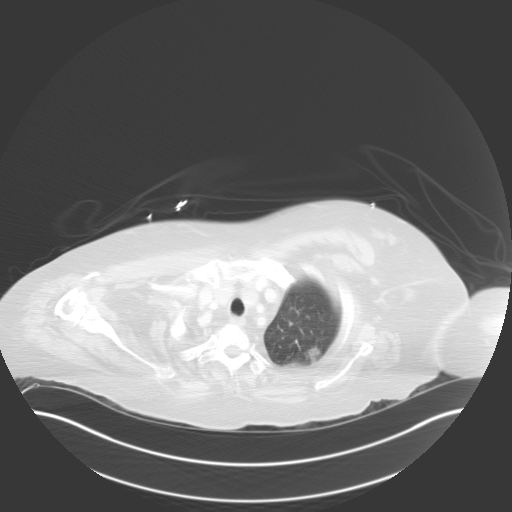

[Series 6: coronal · coronal · 0.92mm/px · 3 of 103 slices shown]
[im 21/103  lung]
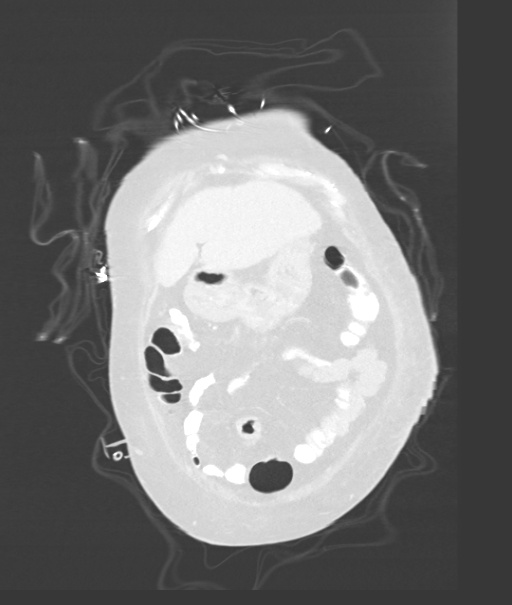
[im 41/103  lung]
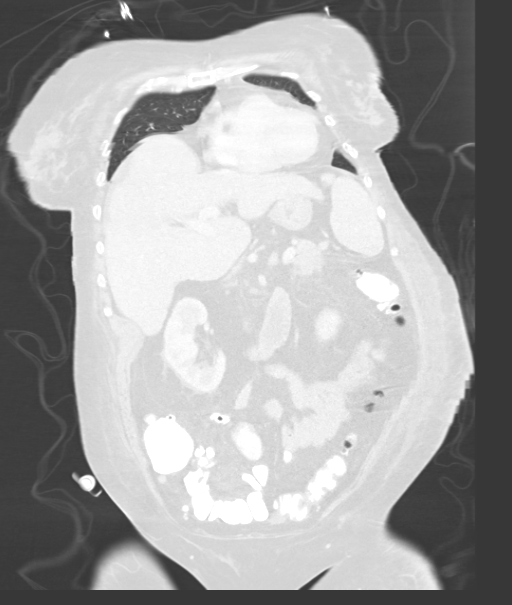
[im 62/103  lung]
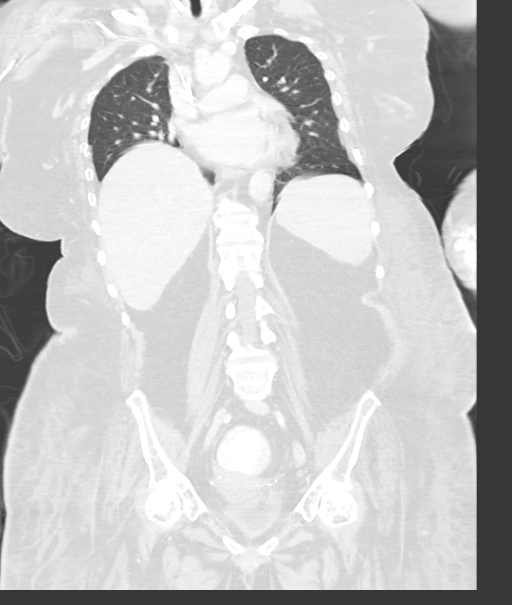

[15 of 36 positions shown; findings below may reference images not displayed]

FINDINGS: CT CHEST FINDINGS

The lungs are well aerated but demonstrates some mild bibasilar
atelectasis. No sizable effusion is seen. A small area of patchy
infiltrate is noted in the posterior aspect of the left upper lobe.
No focal confluent infiltrate is seen. No hilar or mediastinal
adenopathy is seen.

The aorta in its branches are within normal limits with the
exception of atherosclerotic calcifications. No aneurysmal
dilatation or dissection is seen. A PICC line is noted in the distal
superior vena cava. Mild Coronary calcifications are seen.

CT ABDOMEN AND PELVIS FINDINGS

Gallbladder is decompressed demonstrates multiple small gallstones.
The liver, spleen, adrenal glands and pancreas are within normal
limits. The kidneys are well visualized bilaterally without renal
calculi or obstructive changes.

A gastrostomy catheter is seen within the stomach.

The bladder is decompressed by Foley catheter. The appendix is
within normal limits. No significant free pelvic fluid is seen.
Degenerative changes of the thoracic and lumbar spine are noted.
IMPRESSION: Bibasilar atelectasis without focal effusion.

Minimal infiltrate in the posterior aspect of the left upper lobe.

Cholelithiasis without complicating factors.

Chronic changes as described above.  No acute abnormality noted.
